# Patient Record
Sex: Female | Born: 1960 | Race: White | Hispanic: No | Marital: Married | Smoking: Never smoker
Health system: Southern US, Community
[De-identification: ages and names within clinical notes are randomized; demographics above are authoritative.]

## PROBLEM LIST (undated history)

## (undated) DIAGNOSIS — Z8619 Personal history of other infectious and parasitic diseases: Secondary | ICD-10-CM

## (undated) DIAGNOSIS — M1990 Unspecified osteoarthritis, unspecified site: Secondary | ICD-10-CM

## (undated) DIAGNOSIS — F329 Major depressive disorder, single episode, unspecified: Secondary | ICD-10-CM

## (undated) DIAGNOSIS — N200 Calculus of kidney: Secondary | ICD-10-CM

## (undated) DIAGNOSIS — R630 Anorexia: Secondary | ICD-10-CM

## (undated) DIAGNOSIS — F32A Depression, unspecified: Secondary | ICD-10-CM

## (undated) DIAGNOSIS — E785 Hyperlipidemia, unspecified: Secondary | ICD-10-CM

## (undated) DIAGNOSIS — F419 Anxiety disorder, unspecified: Secondary | ICD-10-CM

## (undated) DIAGNOSIS — R011 Cardiac murmur, unspecified: Secondary | ICD-10-CM

## (undated) DIAGNOSIS — I10 Essential (primary) hypertension: Secondary | ICD-10-CM

## (undated) HISTORY — DX: Hyperlipidemia, unspecified: E78.5

## (undated) HISTORY — DX: Personal history of other infectious and parasitic diseases: Z86.19

## (undated) HISTORY — DX: Depression, unspecified: F32.A

## (undated) HISTORY — DX: Cardiac murmur, unspecified: R01.1

## (undated) HISTORY — PX: APPENDECTOMY: SHX54

## (undated) HISTORY — DX: Anxiety disorder, unspecified: F41.9

## (undated) HISTORY — PX: OTHER SURGICAL HISTORY: SHX169

## (undated) HISTORY — DX: Major depressive disorder, single episode, unspecified: F32.9

## (undated) HISTORY — PX: TONSILLECTOMY AND ADENOIDECTOMY: SHX28

## (undated) HISTORY — DX: Anorexia: R63.0

## (undated) HISTORY — PX: ABDOMINAL SURGERY: SHX537

## (undated) HISTORY — DX: Unspecified osteoarthritis, unspecified site: M19.90

---
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
## 1998-03-09 ENCOUNTER — Encounter: Admission: RE | Admit: 1998-03-09 | Discharge: 1998-06-07 | Payer: Self-pay | Admitting: *Deleted

## 1998-09-07 ENCOUNTER — Other Ambulatory Visit: Admission: RE | Admit: 1998-09-07 | Discharge: 1998-09-07 | Payer: Self-pay | Admitting: Obstetrics & Gynecology

## 2000-03-10 ENCOUNTER — Other Ambulatory Visit: Admission: RE | Admit: 2000-03-10 | Discharge: 2000-03-10 | Payer: Self-pay | Admitting: Obstetrics & Gynecology

## 2000-03-31 ENCOUNTER — Ambulatory Visit (HOSPITAL_COMMUNITY): Admission: RE | Admit: 2000-03-31 | Discharge: 2000-03-31 | Payer: Self-pay | Admitting: Obstetrics & Gynecology

## 2001-07-05 ENCOUNTER — Other Ambulatory Visit: Admission: RE | Admit: 2001-07-05 | Discharge: 2001-07-05 | Payer: Self-pay | Admitting: Family Medicine

## 2002-08-27 ENCOUNTER — Other Ambulatory Visit: Admission: RE | Admit: 2002-08-27 | Discharge: 2002-08-27 | Payer: Self-pay | Admitting: Obstetrics & Gynecology

## 2002-09-04 ENCOUNTER — Observation Stay (HOSPITAL_COMMUNITY): Admission: RE | Admit: 2002-09-04 | Discharge: 2002-09-05 | Payer: Self-pay | Admitting: Obstetrics & Gynecology

## 2003-09-09 ENCOUNTER — Other Ambulatory Visit: Admission: RE | Admit: 2003-09-09 | Discharge: 2003-09-09 | Payer: Self-pay | Admitting: Obstetrics & Gynecology

## 2004-10-08 ENCOUNTER — Other Ambulatory Visit: Admission: RE | Admit: 2004-10-08 | Discharge: 2004-10-08 | Payer: Self-pay | Admitting: Obstetrics & Gynecology

## 2004-11-30 ENCOUNTER — Ambulatory Visit: Payer: Self-pay | Admitting: Cardiology

## 2005-10-19 ENCOUNTER — Other Ambulatory Visit: Admission: RE | Admit: 2005-10-19 | Discharge: 2005-10-19 | Payer: Self-pay | Admitting: Obstetrics and Gynecology

## 2006-06-16 ENCOUNTER — Ambulatory Visit: Payer: Self-pay | Admitting: Cardiology

## 2006-07-07 ENCOUNTER — Ambulatory Visit: Payer: Self-pay | Admitting: Cardiology

## 2012-07-30 ENCOUNTER — Other Ambulatory Visit: Payer: Self-pay | Admitting: Obstetrics & Gynecology

## 2012-07-30 DIAGNOSIS — R928 Other abnormal and inconclusive findings on diagnostic imaging of breast: Secondary | ICD-10-CM

## 2012-08-07 ENCOUNTER — Ambulatory Visit
Admission: RE | Admit: 2012-08-07 | Discharge: 2012-08-07 | Disposition: A | Discharge status: Home / Self Care | Payer: BC Managed Care – PPO | Source: Ambulatory Visit | Attending: Obstetrics & Gynecology | Admitting: Obstetrics & Gynecology

## 2012-08-07 DIAGNOSIS — R928 Other abnormal and inconclusive findings on diagnostic imaging of breast: Secondary | ICD-10-CM

## 2013-11-07 HISTORY — PX: BREAST BIOPSY: SHX20

## 2013-11-07 HISTORY — PX: COLONOSCOPY: SHX174

## 2014-05-31 DIAGNOSIS — I10 Essential (primary) hypertension: Secondary | ICD-10-CM

## 2014-05-31 DIAGNOSIS — F329 Major depressive disorder, single episode, unspecified: Secondary | ICD-10-CM | POA: Insufficient documentation

## 2014-05-31 DIAGNOSIS — F3341 Major depressive disorder, recurrent, in partial remission: Secondary | ICD-10-CM

## 2014-05-31 DIAGNOSIS — E785 Hyperlipidemia, unspecified: Secondary | ICD-10-CM | POA: Insufficient documentation

## 2014-05-31 DIAGNOSIS — F32A Depression, unspecified: Secondary | ICD-10-CM | POA: Insufficient documentation

## 2014-05-31 HISTORY — DX: Essential (primary) hypertension: I10

## 2014-05-31 HISTORY — DX: Major depressive disorder, recurrent, in partial remission: F33.41

## 2014-05-31 HISTORY — DX: Hyperlipidemia, unspecified: E78.5

## 2014-06-17 ENCOUNTER — Other Ambulatory Visit: Payer: Self-pay | Admitting: Obstetrics and Gynecology

## 2014-06-17 DIAGNOSIS — N63 Unspecified lump in unspecified breast: Secondary | ICD-10-CM

## 2014-06-17 DIAGNOSIS — N6459 Other signs and symptoms in breast: Secondary | ICD-10-CM

## 2014-06-17 DIAGNOSIS — N644 Mastodynia: Secondary | ICD-10-CM

## 2014-06-18 ENCOUNTER — Other Ambulatory Visit: Payer: Self-pay

## 2014-06-18 ENCOUNTER — Other Ambulatory Visit: Payer: Self-pay | Admitting: Obstetrics and Gynecology

## 2014-06-18 DIAGNOSIS — N6459 Other signs and symptoms in breast: Secondary | ICD-10-CM

## 2014-06-18 DIAGNOSIS — N63 Unspecified lump in unspecified breast: Secondary | ICD-10-CM

## 2014-06-18 DIAGNOSIS — N644 Mastodynia: Secondary | ICD-10-CM

## 2014-06-20 ENCOUNTER — Ambulatory Visit
Admission: RE | Admit: 2014-06-20 | Discharge: 2014-06-20 | Disposition: A | Discharge status: Home / Self Care | Payer: BC Managed Care – PPO | Source: Ambulatory Visit | Attending: Obstetrics and Gynecology | Admitting: Obstetrics and Gynecology

## 2014-06-20 ENCOUNTER — Other Ambulatory Visit: Payer: Self-pay | Admitting: Obstetrics and Gynecology

## 2014-06-20 DIAGNOSIS — N63 Unspecified lump in unspecified breast: Secondary | ICD-10-CM

## 2014-06-20 DIAGNOSIS — N644 Mastodynia: Secondary | ICD-10-CM

## 2014-06-20 DIAGNOSIS — N6459 Other signs and symptoms in breast: Secondary | ICD-10-CM

## 2014-06-27 ENCOUNTER — Ambulatory Visit
Admission: RE | Admit: 2014-06-27 | Discharge: 2014-06-27 | Disposition: A | Discharge status: Home / Self Care | Payer: BC Managed Care – PPO | Source: Ambulatory Visit | Attending: Obstetrics and Gynecology | Admitting: Obstetrics and Gynecology

## 2014-06-27 ENCOUNTER — Other Ambulatory Visit: Payer: BC Managed Care – PPO

## 2014-06-27 ENCOUNTER — Other Ambulatory Visit (HOSPITAL_COMMUNITY): Payer: Self-pay | Admitting: Diagnostic Radiology

## 2014-06-27 ENCOUNTER — Other Ambulatory Visit: Payer: Self-pay | Admitting: Obstetrics and Gynecology

## 2014-06-27 DIAGNOSIS — N6459 Other signs and symptoms in breast: Secondary | ICD-10-CM

## 2014-06-27 DIAGNOSIS — N644 Mastodynia: Secondary | ICD-10-CM

## 2014-06-27 DIAGNOSIS — N63 Unspecified lump in unspecified breast: Secondary | ICD-10-CM

## 2014-06-30 ENCOUNTER — Other Ambulatory Visit: Payer: BC Managed Care – PPO

## 2014-08-14 DIAGNOSIS — Z2821 Immunization not carried out because of patient refusal: Secondary | ICD-10-CM | POA: Insufficient documentation

## 2014-11-25 ENCOUNTER — Other Ambulatory Visit: Payer: Self-pay | Admitting: Obstetrics and Gynecology

## 2014-11-25 DIAGNOSIS — N6320 Unspecified lump in the left breast, unspecified quadrant: Secondary | ICD-10-CM

## 2014-12-02 ENCOUNTER — Other Ambulatory Visit: Payer: Self-pay | Admitting: Obstetrics and Gynecology

## 2014-12-02 DIAGNOSIS — N644 Mastodynia: Secondary | ICD-10-CM

## 2014-12-02 DIAGNOSIS — N6320 Unspecified lump in the left breast, unspecified quadrant: Secondary | ICD-10-CM

## 2014-12-10 ENCOUNTER — Ambulatory Visit
Admission: RE | Admit: 2014-12-10 | Discharge: 2014-12-10 | Disposition: A | Discharge status: Home / Self Care | Payer: BC Managed Care – PPO | Source: Ambulatory Visit | Attending: Obstetrics and Gynecology | Admitting: Obstetrics and Gynecology

## 2014-12-10 DIAGNOSIS — N644 Mastodynia: Secondary | ICD-10-CM

## 2014-12-10 DIAGNOSIS — N6320 Unspecified lump in the left breast, unspecified quadrant: Secondary | ICD-10-CM

## 2015-01-02 ENCOUNTER — Emergency Department (HOSPITAL_BASED_OUTPATIENT_CLINIC_OR_DEPARTMENT_OTHER): Payer: BC Managed Care – PPO

## 2015-01-02 ENCOUNTER — Emergency Department (HOSPITAL_BASED_OUTPATIENT_CLINIC_OR_DEPARTMENT_OTHER)
Admission: EM | Admit: 2015-01-02 | Discharge: 2015-01-03 | Disposition: A | Discharge status: Home / Self Care | Payer: BC Managed Care – PPO | Attending: Emergency Medicine | Admitting: Emergency Medicine

## 2015-01-02 ENCOUNTER — Encounter (HOSPITAL_BASED_OUTPATIENT_CLINIC_OR_DEPARTMENT_OTHER): Payer: Self-pay | Admitting: Emergency Medicine

## 2015-01-02 DIAGNOSIS — R109 Unspecified abdominal pain: Secondary | ICD-10-CM | POA: Diagnosis present

## 2015-01-02 DIAGNOSIS — N201 Calculus of ureter: Secondary | ICD-10-CM | POA: Diagnosis not present

## 2015-01-02 DIAGNOSIS — E871 Hypo-osmolality and hyponatremia: Secondary | ICD-10-CM | POA: Diagnosis not present

## 2015-01-02 DIAGNOSIS — I10 Essential (primary) hypertension: Secondary | ICD-10-CM | POA: Diagnosis not present

## 2015-01-02 HISTORY — DX: Calculus of kidney: N20.0

## 2015-01-02 HISTORY — DX: Essential (primary) hypertension: I10

## 2015-01-02 LAB — BASIC METABOLIC PANEL
Anion gap: 7 (ref 5–15)
BUN: 14 mg/dL (ref 6–23)
CO2: 21 mmol/L (ref 19–32)
CREATININE: 0.86 mg/dL (ref 0.50–1.10)
Calcium: 8.8 mg/dL (ref 8.4–10.5)
Chloride: 105 mmol/L (ref 96–112)
GFR calc Af Amer: 89 mL/min — ABNORMAL LOW (ref >90)
GFR calc non Af Amer: 77 mL/min — ABNORMAL LOW (ref >90)
Glucose, Bld: 114 mg/dL — ABNORMAL HIGH (ref 70–99)
Potassium: 3.5 mmol/L (ref 3.5–5.1)
Sodium: 133 mmol/L — ABNORMAL LOW (ref 135–145)

## 2015-01-02 LAB — CBC WITH DIFFERENTIAL/PLATELET
BASOS ABS: 0 10*3/uL (ref 0.0–0.1)
Basophils Relative: 0 % (ref 0–1)
Eosinophils Absolute: 0 10*3/uL (ref 0.0–0.7)
Eosinophils Relative: 0 % (ref 0–5)
HCT: 38.8 % (ref 36.0–46.0)
Hemoglobin: 12.9 g/dL (ref 12.0–15.0)
LYMPHS PCT: 12 % (ref 12–46)
Lymphs Abs: 1.2 10*3/uL (ref 0.7–4.0)
MCH: 31.8 pg (ref 26.0–34.0)
MCHC: 33.2 g/dL (ref 30.0–36.0)
MCV: 95.6 fL (ref 78.0–100.0)
Monocytes Absolute: 0.5 10*3/uL (ref 0.1–1.0)
Monocytes Relative: 5 % (ref 3–12)
NEUTROS ABS: 8.2 10*3/uL — AB (ref 1.7–7.7)
Neutrophils Relative %: 83 % — ABNORMAL HIGH (ref 43–77)
PLATELETS: 314 10*3/uL (ref 150–400)
RBC: 4.06 MIL/uL (ref 3.87–5.11)
RDW: 12 % (ref 11.5–15.5)
WBC: 10 10*3/uL (ref 4.0–10.5)

## 2015-01-02 LAB — URINALYSIS, ROUTINE W REFLEX MICROSCOPIC
Bilirubin Urine: NEGATIVE
GLUCOSE, UA: NEGATIVE mg/dL
KETONES UR: NEGATIVE mg/dL
Leukocytes, UA: NEGATIVE
NITRITE: NEGATIVE
Protein, ur: NEGATIVE mg/dL
Specific Gravity, Urine: 1.028 (ref 1.005–1.030)
Urobilinogen, UA: 0.2 mg/dL (ref 0.0–1.0)
pH: 5 (ref 5.0–8.0)

## 2015-01-02 LAB — URINE MICROSCOPIC-ADD ON

## 2015-01-02 MED ORDER — HYDROMORPHONE HCL 1 MG/ML IJ SOLN
1.0000 mg | Freq: Once | INTRAMUSCULAR | Status: AC
  Administered 2015-01-02: 1 mg via INTRAVENOUS
  Filled 2015-01-02: qty 1

## 2015-01-02 MED ORDER — ONDANSETRON HCL 4 MG/2ML IJ SOLN
4.0000 mg | Freq: Once | INTRAMUSCULAR | Status: AC
  Administered 2015-01-02: 4 mg via INTRAVENOUS
  Filled 2015-01-02: qty 2

## 2015-01-02 MED ORDER — KETOROLAC TROMETHAMINE 30 MG/ML IJ SOLN
30.0000 mg | Freq: Once | INTRAMUSCULAR | Status: AC
  Administered 2015-01-02: 30 mg via INTRAVENOUS
  Filled 2015-01-02: qty 1

## 2015-01-02 MED ORDER — ONDANSETRON HCL 4 MG/2ML IJ SOLN
4.0000 mg | Freq: Once | INTRAMUSCULAR | Status: AC
  Administered 2015-01-02: 4 mg via INTRAVENOUS

## 2015-01-02 MED ORDER — ONDANSETRON HCL 4 MG/2ML IJ SOLN
INTRAMUSCULAR | Status: AC
  Filled 2015-01-02: qty 2

## 2015-01-02 NOTE — ED Provider Notes (Signed)
CSN: [id number]     Arrival date & time 01/02/15  2148 History  This chart was scribed for Holly Booze, MD by Bronson Curb, ED Scribe. This patient was seen in room MH09/MH09 and the patient's care was started at 10:57 PM.    Chief Complaint  Patient presents with  . Flank Pain    The history is provided by the patient. No language interpreter was used.     HPI Comments: Holly Richardson is a 54 y.o. female, with history of kidney stones, who presents to the Emergency Department complaining of sharp, 10/10, left flank pain that began approximately 6 hours ago. Patient suspects the pain is related to a kidney stone. There is associated nausea, vomiting, and urgency. Patient states she took 2 Percocet approximately 10 minutes ago with only temporary relief. She denies fever or chills.  PCP: Derrell Lolling  Past Medical History  Diagnosis Date  . Kidney stones   . Hypertension   . Kidney stones    Past Surgical History  Procedure Laterality Date  . Abdominal surgery    . Appendectomy    . Cesarean section     No family history on file. History  Substance Use Topics  . Smoking status: Never Smoker   . Smokeless tobacco: Not on file  . Alcohol Use: 4.2 oz/week    7 Standard drinks or equivalent per week   OB History    No data available     Review of Systems  Gastrointestinal: Positive for nausea and vomiting.  Genitourinary: Positive for urgency and flank pain.  All other systems reviewed and are negative.     Allergies  Review of patient's allergies indicates no known allergies.  Home Medications   Prior to Admission medications   Not on File   Triage Vitals: BP 154/92 mmHg  Pulse 87  Temp(Src) 98.1 F (36.7 C) (Oral)  Resp 22  SpO2 99%  Physical Exam  Constitutional: She is oriented to person, place, and time. She appears well-developed and well-nourished.  Appears to be in pain.  HENT:  Head: Normocephalic and atraumatic.  Eyes: Conjunctivae and EOM  are normal.  Neck: Neck supple. No tracheal deviation present.  Cardiovascular: Normal rate.   Pulmonary/Chest: Effort normal. No respiratory distress.  Abdominal: Soft. Bowel sounds are decreased. There is no tenderness.  Bowel sounds are deceased.  Musculoskeletal: Normal range of motion.  Neurological: She is alert and oriented to person, place, and time.  Skin: Skin is warm and dry.  Psychiatric: She has a normal mood and affect. Her behavior is normal.  Nursing note and vitals reviewed.   ED Course  Procedures (including critical care time)  DIAGNOSTIC STUDIES: Oxygen Saturation is 99% on room air, normal by my interpretation.    COORDINATION OF CARE: At 2300 Discussed treatment plan with patient which includes pain medication and CT scan. Patient agrees.   Labs Review Results for orders placed or performed during the hospital encounter of 01/02/15  Urinalysis, Routine w reflex microscopic  Result Value Ref Range   Color, Urine YELLOW YELLOW   APPearance TURBID (A) CLEAR   Specific Gravity, Urine 1.028 1.005 - 1.030   pH 5.0 5.0 - 8.0   Glucose, UA NEGATIVE NEGATIVE mg/dL   Hgb urine dipstick LARGE (A) NEGATIVE   Bilirubin Urine NEGATIVE NEGATIVE   Ketones, ur NEGATIVE NEGATIVE mg/dL   Protein, ur NEGATIVE NEGATIVE mg/dL   Urobilinogen, UA 0.2 0.0 - 1.0 mg/dL   Nitrite NEGATIVE NEGATIVE  Leukocytes, UA NEGATIVE NEGATIVE  Urine microscopic-add on  Result Value Ref Range   Squamous Epithelial / LPF RARE RARE   WBC, UA 0-2 <3 WBC/hpf   RBC / HPF 11-20 <3 RBC/hpf   Bacteria, UA RARE RARE   Urine-Other AMORPHOUS URATES/PHOSPHATES   CBC with Differential  Result Value Ref Range   WBC 10.0 4.0 - 10.5 K/uL   RBC 4.06 3.87 - 5.11 MIL/uL   Hemoglobin 12.9 12.0 - 15.0 g/dL   HCT 16.1 09.6 - 04.5 %   MCV 95.6 78.0 - 100.0 fL   MCH 31.8 26.0 - 34.0 pg   MCHC 33.2 30.0 - 36.0 g/dL   RDW 40.9 81.1 - 91.4 %   Platelets 314 150 - 400 K/uL   Neutrophils Relative % 83 (H)  43 - 77 %   Neutro Abs 8.2 (H) 1.7 - 7.7 K/uL   Lymphocytes Relative 12 12 - 46 %   Lymphs Abs 1.2 0.7 - 4.0 K/uL   Monocytes Relative 5 3 - 12 %   Monocytes Absolute 0.5 0.1 - 1.0 K/uL   Eosinophils Relative 0 0 - 5 %   Eosinophils Absolute 0.0 0.0 - 0.7 K/uL   Basophils Relative 0 0 - 1 %   Basophils Absolute 0.0 0.0 - 0.1 K/uL  Basic metabolic panel  Result Value Ref Range   Sodium 133 (L) 135 - 145 mmol/L   Potassium 3.5 3.5 - 5.1 mmol/L   Chloride 105 96 - 112 mmol/L   CO2 21 19 - 32 mmol/L   Glucose, Bld 114 (H) 70 - 99 mg/dL   BUN 14 6 - 23 mg/dL   Creatinine, Ser 7.82 0.50 - 1.10 mg/dL   Calcium 8.8 8.4 - 95.6 mg/dL   GFR calc non Af Amer 77 (L) >90 mL/min   GFR calc Af Amer 89 (L) >90 mL/min   Anion gap 7 5 - 15    Imaging Review Ct Renal Stone Study  01/02/2015   CLINICAL DATA:  Left flank pain for 6 hr. History of kidney stones and hypertension.  EXAM: CT ABDOMEN AND PELVIS WITHOUT CONTRAST  TECHNIQUE: Multidetector CT imaging of the abdomen and pelvis was performed following the standard protocol without IV contrast.  COMPARISON:  None.  FINDINGS: Lung bases are clear.  4.4 mm stone in the distal left ureter at the ureterovesical junction with moderate proximal hydronephrosis and hydroureter. Stranding around the left ureter and kidney. No additional stones demonstrated. Right kidney in ureter appear normal. Bladder is decompressed.  Unenhanced appearance of the liver, spleen, gallbladder, pancreas, adrenal glands, inferior vena cava, and retroperitoneal lymph nodes is unremarkable. Calcification of the abdominal aorta without aneurysm. Stomach, small bowel, and colon are not abnormally distended. No free air or free fluid in the abdomen. Small umbilical hernia containing fat.  Pelvis: Uterus and ovaries are not enlarged. No free or loculated pelvic fluid collections. No pelvic mass or lymphadenopathy. Appendix is not identified. Degenerative changes in the spine. No  destructive bone lesions.  IMPRESSION: 4.4 mm stone in the distal left ureter with moderate proximal obstruction.   Electronically Signed   By: Burman Nieves M.D.   On: 01/02/2015 23:50    Images viewed by me.   MDM   Final diagnoses:  Left flank pain  Ureterolithiasis  Hyponatremia    Left flank pain consistent with ureteral colic. Urinalysis does show hematuria. She had only partial relief with initial dose of hydromorphone but she did have good relief of  nausea with ondansetron. She is given a second dose of hydromorphone with slight further improvement of pain. She is given a third dose of hydromorphone with some additional ondansetron and also dose of ketorolac. Following this, she had good relief of pain. Old records are reviewed and she has no relevant past visits and no imaging studies of the renal system in our system. She is sent for CT of the abdomen and pelvis which does show a 4.4 mm calculus in the distal left ureter with mild to moderate hydronephrosis. She is discharged with prescriptions for oxycodone-acetaminophen, tamsulosin, and ondansetron and is referred to urology for follow-up.  I personally performed the services described in this documentation, which was scribed in my presence. The recorded information has been reviewed and is accurate.      Holly Boozeavid Tinya Cadogan, MD 01/03/15 [phone]

## 2015-01-02 NOTE — ED Notes (Signed)
Patient transported to CT 

## 2015-01-02 NOTE — ED Notes (Addendum)
54 yo with left sided flank pain today with Vomiting. Hx of kidney stones. Took 2 percocet at pm w/ no relief. 10/10

## 2015-01-03 MED ORDER — DIPHENHYDRAMINE HCL 50 MG/ML IJ SOLN
25.0000 mg | Freq: Once | INTRAMUSCULAR | Status: AC
  Administered 2015-01-03: 25 mg via INTRAVENOUS

## 2015-01-03 MED ORDER — TAMSULOSIN HCL 0.4 MG PO CAPS: 0.4000 mg | ORAL_CAPSULE | Freq: Every day | ORAL | Status: DC

## 2015-01-03 MED ORDER — METOCLOPRAMIDE HCL 5 MG/ML IJ SOLN
INTRAMUSCULAR | Status: AC
  Administered 2015-01-03: 10 mg via INTRAVENOUS
  Filled 2015-01-03: qty 2

## 2015-01-03 MED ORDER — ONDANSETRON HCL 4 MG PO TABS: 4.0000 mg | ORAL_TABLET | Freq: Four times a day (QID) | ORAL | Status: DC | PRN

## 2015-01-03 MED ORDER — OXYCODONE-ACETAMINOPHEN 7.5-325 MG PO TABS: 1.0000 | ORAL_TABLET | ORAL | Status: DC | PRN

## 2015-01-03 MED ORDER — METOCLOPRAMIDE HCL 5 MG/ML IJ SOLN
10.0000 mg | Freq: Once | INTRAMUSCULAR | Status: AC
  Administered 2015-01-03: 10 mg via INTRAVENOUS

## 2015-01-03 MED ORDER — DIPHENHYDRAMINE HCL 50 MG/ML IJ SOLN
INTRAMUSCULAR | Status: AC
  Administered 2015-01-03: 25 mg via INTRAVENOUS
  Filled 2015-01-03: qty 1

## 2015-01-03 NOTE — Discharge Instructions (Signed)
Return if pain is not being controlled adequately, you are vomiting, or if you start running a fever.  Kidney Stones Kidney stones (urolithiasis) are deposits that form inside your kidneys. The intense pain is caused by the stone moving through the urinary tract. When the stone moves, the ureter goes into spasm around the stone. The stone is usually passed in the urine.  CAUSES   A disorder that makes certain neck glands produce too much parathyroid hormone (primary hyperparathyroidism).  A buildup of uric acid crystals, similar to gout in your joints.  Narrowing (stricture) of the ureter.  A kidney obstruction present at birth (congenital obstruction).  Previous surgery on the kidney or ureters.  Numerous kidney infections. SYMPTOMS   Feeling sick to your stomach (nauseous).  Throwing up (vomiting).  Blood in the urine (hematuria).  Pain that usually spreads (radiates) to the groin.  Frequency or urgency of urination. DIAGNOSIS   Taking a history and physical exam.  Blood or urine tests.  CT scan.  Occasionally, an examination of the inside of the urinary bladder (cystoscopy) is performed. TREATMENT   Observation.  Increasing your fluid intake.  Extracorporeal shock wave lithotripsy--This is a noninvasive procedure that uses shock waves to break up kidney stones.  Surgery may be needed if you have severe pain or persistent obstruction. There are various surgical procedures. Most of the procedures are performed with the use of small instruments. Only small incisions are needed to accommodate these instruments, so recovery time is minimized. The size, location, and chemical composition are all important variables that will determine the proper choice of action for you. Talk to your health care provider to better understand your situation so that you will minimize the risk of injury to yourself and your kidney.  HOME CARE INSTRUCTIONS   Drink enough water and fluids to  keep your urine clear or pale yellow. This will help you to pass the stone or stone fragments.  Strain all urine through the provided strainer. Keep all particulate matter and stones for your health care provider to see. The stone causing the pain may be as small as a grain of salt. It is very important to use the strainer each and every time you pass your urine. The collection of your stone will allow your health care provider to analyze it and verify that a stone has actually passed. The stone analysis will often identify what you can do to reduce the incidence of recurrences.  Only take over-the-counter or prescription medicines for pain, discomfort, or fever as directed by your health care provider.  Make a follow-up appointment with your health care provider as directed.  Get follow-up X-rays if required. The absence of pain does not always mean that the stone has passed. It may have only stopped moving. If the urine remains completely obstructed, it can cause loss of kidney function or even complete destruction of the kidney. It is your responsibility to make sure X-rays and follow-ups are completed. Ultrasounds of the kidney can show blockages and the status of the kidney. Ultrasounds are not associated with any radiation and can be performed easily in a matter of minutes. SEEK MEDICAL CARE IF:  You experience pain that is progressive and unresponsive to any pain medicine you have been prescribed. SEEK IMMEDIATE MEDICAL CARE IF:   Pain cannot be controlled with the prescribed medicine.  You have a fever or shaking chills.  The severity or intensity of pain increases over 18 hours and is not relieved  by pain medicine.  You develop a new onset of abdominal pain.  You feel faint or pass out.  You are unable to urinate. MAKE SURE YOU:   Understand these instructions.  Will watch your condition.  Will get help right away if you are not doing well or get worse. Document Released:  10/24/2005 Document Revised: 06/26/2013 Document Reviewed: 03/27/2013 [facility] Patient Information 2015 [place], [place]. This information is not intended to replace advice given to you by your health care provider. Make sure you discuss any questions you have with your health care provider.  Acetaminophen; Oxycodone tablets What is this medicine? ACETAMINOPHEN; OXYCODONE (a set a MEE noe fen; ox i KOE done) is a pain reliever. It is used to treat mild to moderate pain. This medicine may be used for other purposes; ask your health care provider or pharmacist if you have questions. COMMON BRAND NAME(S): Endocet, Magnacet, Narvox, Percocet, Perloxx, Primalev, Primlev, Roxicet, Xolox What should I tell my health care provider before I take this medicine? They need to know if you have any of these conditions: -brain tumor -Crohn's disease, inflammatory bowel disease, or ulcerative colitis -drug abuse or addiction -head injury -heart or circulation problems -if you often drink alcohol -kidney disease or problems going to the bathroom -liver disease -lung disease, asthma, or breathing problems -an unusual or allergic reaction to acetaminophen, oxycodone, other opioid analgesics, other medicines, foods, dyes, or preservatives -pregnant or trying to get pregnant -breast-feeding How should I use this medicine? Take this medicine by mouth with a full glass of water. Follow the directions on the prescription label. Take your medicine at regular intervals. Do not take your medicine more often than directed. Talk to your pediatrician regarding the use of this medicine in children. Special care may be needed. Patients over 39 years old may have a stronger reaction and need a smaller dose. Overdosage: If you think you have taken too much of this medicine contact a poison control center or emergency room at once. NOTE: This medicine is only for you. Do not share this medicine with others. What if I miss a  dose? If you miss a dose, take it as soon as you can. If it is almost time for your next dose, take only that dose. Do not take double or extra doses. What may interact with this medicine? -alcohol -antihistamines -barbiturates like amobarbital, butalbital, butabarbital, methohexital, pentobarbital, phenobarbital, thiopental, and secobarbital -benztropine -drugs for bladder problems like solifenacin, trospium, oxybutynin, tolterodine, hyoscyamine, and methscopolamine -drugs for breathing problems like ipratropium and tiotropium -drugs for certain stomach or intestine problems like propantheline, homatropine methylbromide, glycopyrrolate, atropine, belladonna, and dicyclomine -general anesthetics like etomidate, ketamine, nitrous oxide, propofol, desflurane, enflurane, halothane, isoflurane, and sevoflurane -medicines for depression, anxiety, or psychotic disturbances -medicines for sleep -muscle relaxants -naltrexone -narcotic medicines (opiates) for pain -phenothiazines like perphenazine, thioridazine, chlorpromazine, mesoridazine, fluphenazine, prochlorperazine, promazine, and trifluoperazine -scopolamine -tramadol -trihexyphenidyl This list may not describe all possible interactions. Give your health care provider a list of all the medicines, herbs, non-prescription drugs, or dietary supplements you use. Also tell them if you smoke, drink alcohol, or use illegal drugs. Some items may interact with your medicine. What should I watch for while using this medicine? Tell your doctor or health care professional if your pain does not go away, if it gets worse, or if you have new or a different type of pain. You may develop tolerance to the medicine. Tolerance means that you will need a higher dose of  the medication for pain relief. Tolerance is normal and is expected if you take this medicine for a long time. Do not suddenly stop taking your medicine because you may develop a severe reaction.  Your body becomes used to the medicine. This does NOT mean you are addicted. Addiction is a behavior related to getting and using a drug for a non-medical reason. If you have pain, you have a medical reason to take pain medicine. Your doctor will tell you how much medicine to take. If your doctor wants you to stop the medicine, the dose will be slowly lowered over time to avoid any side effects. You may get drowsy or dizzy. Do not drive, use machinery, or do anything that needs mental alertness until you know how this medicine affects you. Do not stand or sit up quickly, especially if you are an older patient. This reduces the risk of dizzy or fainting spells. Alcohol may interfere with the effect of this medicine. Avoid alcoholic drinks. There are different types of narcotic medicines (opiates) for pain. If you take more than one type at the same time, you may have more side effects. Give your health care provider a list of all medicines you use. Your doctor will tell you how much medicine to take. Do not take more medicine than directed. Call emergency for help if you have problems breathing. The medicine will cause constipation. Try to have a bowel movement at least every 2 to 3 days. If you do not have a bowel movement for 3 days, call your doctor or health care professional. Do not take Tylenol (acetaminophen) or medicines that have acetaminophen with this medicine. Too much acetaminophen can be very dangerous. Many nonprescription medicines contain acetaminophen. Always read the labels carefully to avoid taking more acetaminophen. What side effects may I notice from receiving this medicine? Side effects that you should report to your doctor or health care professional as soon as possible: -allergic reactions like skin rash, itching or hives, swelling of the face, lips, or tongue -breathing difficulties, wheezing -confusion -light headedness or fainting spells -severe stomach pain -unusually weak or  tired -yellowing of the skin or the whites of the eyes Side effects that usually do not require medical attention (report to your doctor or health care professional if they continue or are bothersome): -dizziness -drowsiness -nausea -vomiting This list may not describe all possible side effects. Call your doctor for medical advice about side effects. You may report side effects to FDA at 1-800-FDA-1088. Where should I keep my medicine? Keep out of the reach of children. This medicine can be abused. Keep your medicine in a safe place to protect it from theft. Do not share this medicine with anyone. Selling or giving away this medicine is dangerous and against the law. Store at room temperature between 20 and 25 degrees C (68 and 77 degrees F). Keep container tightly closed. Protect from light. This medicine may cause accidental overdose and death if it is taken by other adults, children, or pets. Flush any unused medicine down the toilet to reduce the chance of harm. Do not use the medicine after the expiration date. NOTE: This sheet is a summary. It may not cover all possible information. If you have questions about this medicine, talk to your doctor, pharmacist, or health care provider.  2015, [employer] Standard. (2013-06-17 13:17:35)  Tamsulosin capsules What is this medicine? TAMSULOSIN (tam SOO loe sin) is used to treat enlargement of the prostate gland in men, a condition  called benign prostatic hyperplasia or BPH. It is not for use in women. It works by relaxing muscles in the prostate and bladder neck. This improves urine flow and reduces BPH symptoms. This medicine may be used for other purposes; ask your health care provider or pharmacist if you have questions. COMMON BRAND NAME(S): Flomax What should I tell my health care provider before I take this medicine? They need to know if you have any of the following conditions: -advanced kidney disease -advanced liver disease -low  blood pressure -prostate cancer -an unusual or allergic reaction to tamsulosin, sulfa drugs, other medicines, foods, dyes, or preservatives -pregnant or trying to get pregnant -breast-feeding How should I use this medicine? Take this medicine by mouth about 30 minutes after the same meal every day. Follow the directions on the prescription label. Swallow the capsules whole with a glass of water. Do not crush, chew, or open capsules. Do not take your medicine more often than directed. Do not stop taking your medicine unless your doctor tells you to. Talk to your pediatrician regarding the use of this medicine in children. Special care may be needed. Overdosage: If you think you have taken too much of this medicine contact a poison control center or emergency room at once. NOTE: This medicine is only for you. Do not share this medicine with others. What if I miss a dose? If you miss a dose, take it as soon as you can. If it is almost time for your next dose, take only that dose. Do not take double or extra doses. If you stop taking your medicine for several days or more, ask your doctor or health care professional what dose you should start back on. What may interact with this medicine? -cimetidine -fluoxetine -ketoconazole -medicines for erectile disfunction like sildenafil, tadalafil, vardenafil -medicines for high blood pressure -other alpha-blockers like alfuzosin, doxazosin, phentolamine, phenoxybenzamine, prazosin, terazosin -warfarin This list may not describe all possible interactions. Give your health care provider a list of all the medicines, herbs, non-prescription drugs, or dietary supplements you use. Also tell them if you smoke, drink alcohol, or use illegal drugs. Some items may interact with your medicine. What should I watch for while using this medicine? Visit your doctor or health care professional for regular check ups. You will need lab work done before you start this  medicine and regularly while you are taking it. Check your blood pressure as directed. Ask your health care professional what your blood pressure should be, and when you should contact him or her. This medicine may make you feel dizzy or lightheaded. This is more likely to happen after the first dose, after an increase in dose, or during hot weather or exercise. Drinking alcohol and taking some medicines can make this worse. Do not drive, use machinery, or do anything that needs mental alertness until you know how this medicine affects you. Do not sit or stand up quickly. If you begin to feel dizzy, sit down until you feel better. These effects can decrease once your body adjusts to the medicine. Contact your doctor or health care professional right away if you have an erection that lasts longer than 4 hours or if it becomes painful. This may be a sign of a serious problem and must be treated right away to prevent permanent damage. If you are thinking of having cataract surgery, tell your eye surgeon that you have taken this medicine. What side effects may I notice from receiving this medicine? Side effects  that you should report to your doctor or health care professional as soon as possible: -allergic reactions like skin rash or itching, hives, swelling of the lips, mouth, tongue, or throat -breathing problems -change in vision -feeling faint or lightheaded -irregular heartbeat -prolonged or painful erection -weakness Side effects that usually do not require medical attention (report to your doctor or health care professional if they continue or are bothersome): -back pain -change in sex drive or performance -constipation, nausea or vomiting -cough -drowsy -runny or stuffy nose -trouble sleeping This list may not describe all possible side effects. Call your doctor for medical advice about side effects. You may report side effects to FDA at 1-800-FDA-1088. Where should I keep my  medicine? Keep out of the reach of children. Store at room temperature between 15 and 30 degrees C (59 and 86 degrees F). Throw away any unused medicine after the expiration date. NOTE: This sheet is a summary. It may not cover all possible information. If you have questions about this medicine, talk to your doctor, pharmacist, or health care provider.  2015, [employer] Standard. (2012-10-24 14:11:34)  Ondansetron tablets What is this medicine? ONDANSETRON (on DAN se tron) is used to treat nausea and vomiting caused by chemotherapy. It is also used to prevent or treat nausea and vomiting after surgery. This medicine may be used for other purposes; ask your health care provider or pharmacist if you have questions. COMMON BRAND NAME(S): Zofran What should I tell my health care provider before I take this medicine? They need to know if you have any of these conditions: -heart disease -history of irregular heartbeat -liver disease -low levels of magnesium or potassium in the blood -an unusual or allergic reaction to ondansetron, granisetron, other medicines, foods, dyes, or preservatives -pregnant or trying to get pregnant -breast-feeding How should I use this medicine? Take this medicine by mouth with a glass of water. Follow the directions on your prescription label. Take your doses at regular intervals. Do not take your medicine more often than directed. Talk to your pediatrician regarding the use of this medicine in children. Special care may be needed. Overdosage: If you think you have taken too much of this medicine contact a poison control center or emergency room at once. NOTE: This medicine is only for you. Do not share this medicine with others. What if I miss a dose? If you miss a dose, take it as soon as you can. If it is almost time for your next dose, take only that dose. Do not take double or extra doses. What may interact with this medicine? Do not take this medicine with  any of the following medications: -apomorphine -certain medicines for fungal infections like fluconazole, itraconazole, ketoconazole, posaconazole, voriconazole -cisapride -dofetilide -dronedarone -pimozide -thioridazine -ziprasidone This medicine may also interact with the following medications: -carbamazepine -certain medicines for depression, anxiety, or psychotic disturbances -fentanyl -linezolid -MAOIs like Carbex, Eldepryl, Marplan, Nardil, and Parnate -methylene blue (injected into a vein) -other medicines that prolong the QT interval (cause an abnormal heart rhythm) -phenytoin -rifampicin -tramadol This list may not describe all possible interactions. Give your health care provider a list of all the medicines, herbs, non-prescription drugs, or dietary supplements you use. Also tell them if you smoke, drink alcohol, or use illegal drugs. Some items may interact with your medicine. What should I watch for while using this medicine? Check with your doctor or health care professional right away if you have any sign of an allergic reaction. What side  effects may I notice from receiving this medicine? Side effects that you should report to your doctor or health care professional as soon as possible: -allergic reactions like skin rash, itching or hives, swelling of the face, lips or tongue -breathing problems -confusion -dizziness -fast or irregular heartbeat -feeling faint or lightheaded, falls -fever and chills -loss of balance or coordination -seizures -sweating -swelling of the hands or feet -tightness in the chest -tremors -unusually weak or tired Side effects that usually do not require medical attention (report to your doctor or health care professional if they continue or are bothersome): -constipation or diarrhea -headache This list may not describe all possible side effects. Call your doctor for medical advice about side effects. You may report side effects to FDA  at 1-800-FDA-1088. Where should I keep my medicine? Keep out of the reach of children. Store between 2 and 30 degrees C (36 and 86 degrees F). Throw away any unused medicine after the expiration date. NOTE: This sheet is a summary. It may not cover all possible information. If you have questions about this medicine, talk to your doctor, pharmacist, or health care provider.  2015, [employer] Standard. (2013-07-31 16:27:45)

## 2015-01-04 LAB — URINE CULTURE: Colony Count: 35000

## 2015-07-09 ENCOUNTER — Other Ambulatory Visit: Payer: Self-pay | Admitting: Obstetrics and Gynecology

## 2015-07-09 DIAGNOSIS — N6310 Unspecified lump in the right breast, unspecified quadrant: Principal | ICD-10-CM

## 2015-07-09 DIAGNOSIS — N6001 Solitary cyst of right breast: Secondary | ICD-10-CM

## 2015-07-09 DIAGNOSIS — N6315 Unspecified lump in the right breast, overlapping quadrants: Secondary | ICD-10-CM

## 2015-07-17 ENCOUNTER — Ambulatory Visit
Admission: RE | Admit: 2015-07-17 | Discharge: 2015-07-17 | Disposition: A | Discharge status: Home / Self Care | Payer: BC Managed Care – PPO | Source: Ambulatory Visit | Attending: Obstetrics and Gynecology | Admitting: Obstetrics and Gynecology

## 2015-07-17 DIAGNOSIS — N6315 Unspecified lump in the right breast, overlapping quadrants: Secondary | ICD-10-CM

## 2015-07-17 DIAGNOSIS — N6001 Solitary cyst of right breast: Secondary | ICD-10-CM

## 2015-07-17 DIAGNOSIS — N6310 Unspecified lump in the right breast, unspecified quadrant: Principal | ICD-10-CM

## 2015-07-29 ENCOUNTER — Telehealth: Payer: Self-pay | Admitting: *Deleted

## 2015-07-29 NOTE — Telephone Encounter (Signed)
called for fm hx & status, no answer either number.Holly KitchenMarland Richardson

## 2015-08-03 ENCOUNTER — Ambulatory Visit (INDEPENDENT_AMBULATORY_CARE_PROVIDER_SITE_OTHER): Payer: BC Managed Care – PPO | Admitting: Cardiovascular Disease

## 2015-08-03 ENCOUNTER — Encounter: Payer: Self-pay | Admitting: Cardiovascular Disease

## 2015-08-03 VITALS — BP 112/90 | HR 67 | Ht 65.0 in | Wt 177.4 lb

## 2015-08-03 DIAGNOSIS — R011 Cardiac murmur, unspecified: Secondary | ICD-10-CM | POA: Diagnosis not present

## 2015-08-03 DIAGNOSIS — I493 Ventricular premature depolarization: Secondary | ICD-10-CM | POA: Diagnosis not present

## 2015-08-03 DIAGNOSIS — E785 Hyperlipidemia, unspecified: Secondary | ICD-10-CM

## 2015-08-03 DIAGNOSIS — I10 Essential (primary) hypertension: Secondary | ICD-10-CM | POA: Diagnosis not present

## 2015-08-03 HISTORY — DX: Cardiac murmur, unspecified: R01.1

## 2015-08-03 LAB — BASIC METABOLIC PANEL
BUN: 12 mg/dL (ref 6–23)
CO2: 25 mEq/L (ref 19–32)
CREATININE: 0.65 mg/dL (ref 0.40–1.20)
Calcium: 9.7 mg/dL (ref 8.4–10.5)
Chloride: 103 mEq/L (ref 96–112)
GFR: 101.77 mL/min (ref >60.00)
GLUCOSE: 100 mg/dL — AB (ref 70–99)
POTASSIUM: 3.8 meq/L (ref 3.5–5.1)
Sodium: 139 mEq/L (ref 135–145)

## 2015-08-03 LAB — TSH: TSH: 3.07 u[IU]/mL (ref 0.35–4.50)

## 2015-08-03 NOTE — Patient Instructions (Signed)
Medication Instructions:  Your physician recommends that you continue on your current medications as directed. Please refer to the Current Medication list given to you today.   Labwork: TODAY - basic metabolic panel, TSH   Testing/Procedures: Your physician has requested that you have an echocardiogram. Echocardiography is a painless test that uses sound waves to create images of your heart. It provides your doctor with information about the size and shape of your heart and how well your heart's chambers and valves are working. This procedure takes approximately one hour. There are no restrictions for this procedure.   Follow-Up: Your physician recommends that you schedule a follow-up appointment in: as needed with Dr. Elease Hashimoto.

## 2015-08-03 NOTE — Progress Notes (Signed)
Cardiology Office Note   Date:  08/03/2015   ID:  Holly Richardson, DOB 11/30/1960, MRN [id number]  PCP:  Malka So., MD  Cardiologist:   Vesta Mixer, MD   Chief Complaint  Patient presents with  . Hypertension   Problem List 1. Essential Hypertension  2. Heart murmur  3. Hyperlipidemia    History of Present Illness: Holly Richardson is a 54 y.o. female who presents for  A new heart murmur. Holly Richardson is fairly active.  Walks at her work  No CP or dyspnea  Was found to have a heart murmur at her last physical exam .     Past Medical History  Diagnosis Date  . Kidney stones   . Hypertension   . Kidney stones     Past Surgical History  Procedure Laterality Date  . Abdominal surgery    . Appendectomy    . Cesarean section       Current Outpatient Prescriptions  Medication Sig Dispense Refill  . atorvastatin (LIPITOR) 10 MG tablet Take 10 mg by mouth daily.    . fenofibrate 160 MG tablet Take 160 mg by mouth daily.    Marland Kitchen lamoTRIgine (LAMICTAL) 100 MG tablet Take 100 mg by mouth daily. TAKE 2.5 TABLETS BY MOUTH DAILY    . telmisartan (MICARDIS) 20 MG tablet Take 20 mg by mouth daily.    . Vortioxetine HBr (TRINTELLIX) 5 MG TABS Take 5 mg by mouth daily.     No current facility-administered medications for this visit.    Allergies:   Codeine    Social History:  The patient  reports that she has never smoked. She does not have any smokeless tobacco history on file. She reports that she drinks about 4.2 oz of alcohol per week. She reports that she does not use illicit drugs.   Family History:  The patient's family history includes CAD in her mother.    ROS:  Please see the history of present illness.    Review of Systems: Constitutional:  denies fever, chills, diaphoresis, appetite change and fatigue.  HEENT: denies photophobia, eye pain, redness, hearing loss, ear pain, congestion, sore throat, rhinorrhea, sneezing, neck pain, neck  stiffness and tinnitus.  Respiratory: denies SOB, DOE, cough, chest tightness, and wheezing.  Cardiovascular: denies chest pain, palpitations and leg swelling.  Gastrointestinal: denies nausea, vomiting, abdominal pain, diarrhea, constipation, blood in stool.  Genitourinary: denies dysuria, urgency, frequency, hematuria, flank pain and difficulty urinating.  Musculoskeletal: denies  myalgias, back pain, joint swelling, arthralgias and gait problem.   Skin: denies pallor, rash and wound.  Neurological: denies dizziness, seizures, syncope, weakness, light-headedness, numbness and headaches.   Hematological: denies adenopathy, easy bruising, personal or family bleeding history.  Psychiatric/ Behavioral: denies suicidal ideation, mood changes, confusion, nervousness, sleep disturbance and agitation.       All other systems are reviewed and negative.    PHYSICAL EXAM: VS:  BP 112/90 mmHg  Pulse 67  Ht  (1.651 m)  Wt 80.468 kg (177 lb 6.4 oz)  BMI 29.52 kg/m2 , BMI Body mass index is 29.52 kg/(m^2). GEN: Well nourished, well developed, in no acute distress HEENT: normal Neck: no JVD, carotid bruits, or masses Cardiac: RRR; no murmurs, rubs, or gallops,no edema  Respiratory:  clear to auscultation bilaterally, normal work of breathing GI: soft, nontender, nondistended, + BS MS: no deformity or atrophy Skin: warm and dry, no rash Neuro:  Strength and sensation are intact Psych: normal  EKG:  EKG is ordered today. The ekg ordered today demonstrates NSR at 67 . Occasional PVCs.    Recent Labs: 01/02/2015: BUN 14; Creatinine, Ser 0.86; Hemoglobin 12.9; Platelets 314; Potassium 3.5; Sodium 133*    Lipid Panel No results found for: CHOL, TRIG, HDL, CHOLHDL, VLDL, LDLCALC, LDLDIRECT    Wt Readings from Last 3 Encounters:  08/03/15 80.468 kg (177 lb 6.4 oz)      Other studies Reviewed: Additional studies/ records that were reviewed today include: . Review of the above  records demonstrates:    ASSESSMENT AND PLAN:  1. Essential Hypertension -  Her blood pressure is fairly well-controlled. Continue current medications. 2. Heart murmur -  She has very soft systolic murmur. We'll get an  Echocardiogram  for further evaluation. 3. Hyperlipidemia -  She's currently on fenofibrate 160 mg  and atorvastatin 10 mg . Her lipids are followed by her general medical doctor.  4. Premature ventricular contractions. She has frequent PVCs on exam and on her EKG.Holly Richardson check a basic medical profile also check a TSH today. We'll follow her as needed.  Current medicines are reviewed at length with the patient today.  The patient does not have concerns regarding medicines.  The following changes have been made:  no change  Labs/ tests ordered today include:   Orders Placed This Encounter  Procedures  . Basic Metabolic Panel (BMET)  . TSH  . EKG 12-Lead  . Echocardiogram     Disposition:   FU with me as needed.      Nahser, Deloris Ping, MD  08/03/2015 4:02 PM    [facility] [address] [place], [place], [place]  [address] Phone: [phone]; Fax: [phone]   [facility]  [address] [place], [place]  [address] [phone]   Fax [phone]

## 2015-08-04 ENCOUNTER — Telehealth: Payer: Self-pay | Admitting: Cardiovascular Disease

## 2015-08-04 NOTE — Telephone Encounter (Signed)
New message    Patient calling regarding test results    Can leave test results on answer machine.

## 2015-08-04 NOTE — Telephone Encounter (Signed)
Lab results and plan of care reviewed with patient who verbalized understanding and agreement 

## 2015-09-07 ENCOUNTER — Other Ambulatory Visit: Payer: Self-pay

## 2015-09-07 ENCOUNTER — Ambulatory Visit (HOSPITAL_COMMUNITY): Payer: BC Managed Care – PPO | Attending: Cardiovascular Disease

## 2015-09-07 DIAGNOSIS — I071 Rheumatic tricuspid insufficiency: Secondary | ICD-10-CM | POA: Diagnosis not present

## 2015-09-07 DIAGNOSIS — I358 Other nonrheumatic aortic valve disorders: Secondary | ICD-10-CM | POA: Insufficient documentation

## 2015-09-07 DIAGNOSIS — I10 Essential (primary) hypertension: Secondary | ICD-10-CM | POA: Insufficient documentation

## 2015-09-07 DIAGNOSIS — R011 Cardiac murmur, unspecified: Secondary | ICD-10-CM

## 2016-01-05 ENCOUNTER — Other Ambulatory Visit: Payer: Self-pay

## 2016-01-05 DIAGNOSIS — Z1231 Encounter for screening mammogram for malignant neoplasm of breast: Secondary | ICD-10-CM

## 2016-01-20 ENCOUNTER — Ambulatory Visit: Payer: BC Managed Care – PPO

## 2016-02-09 ENCOUNTER — Ambulatory Visit
Admission: RE | Admit: 2016-02-09 | Discharge: 2016-02-09 | Disposition: A | Discharge status: Home / Self Care | Payer: BC Managed Care – PPO | Source: Ambulatory Visit

## 2016-02-09 DIAGNOSIS — Z1231 Encounter for screening mammogram for malignant neoplasm of breast: Secondary | ICD-10-CM

## 2016-10-24 DIAGNOSIS — E88819 Insulin resistance, unspecified: Secondary | ICD-10-CM | POA: Insufficient documentation

## 2016-10-24 DIAGNOSIS — E8881 Metabolic syndrome: Secondary | ICD-10-CM | POA: Insufficient documentation

## 2016-11-07 HISTORY — PX: BREAST BIOPSY: SHX20

## 2017-03-16 DIAGNOSIS — R140 Abdominal distension (gaseous): Secondary | ICD-10-CM | POA: Insufficient documentation

## 2017-03-16 DIAGNOSIS — K5909 Other constipation: Secondary | ICD-10-CM | POA: Insufficient documentation

## 2017-04-06 ENCOUNTER — Other Ambulatory Visit: Payer: Self-pay | Admitting: Obstetrics and Gynecology

## 2017-04-06 DIAGNOSIS — N63 Unspecified lump in unspecified breast: Secondary | ICD-10-CM

## 2017-04-06 DIAGNOSIS — Z1231 Encounter for screening mammogram for malignant neoplasm of breast: Secondary | ICD-10-CM

## 2017-04-12 ENCOUNTER — Ambulatory Visit
Admission: RE | Admit: 2017-04-12 | Discharge: 2017-04-12 | Disposition: A | Discharge status: Home / Self Care | Payer: BC Managed Care – PPO | Source: Ambulatory Visit | Attending: Obstetrics and Gynecology | Admitting: Obstetrics and Gynecology

## 2017-04-12 ENCOUNTER — Other Ambulatory Visit: Payer: Self-pay | Admitting: Obstetrics and Gynecology

## 2017-04-12 DIAGNOSIS — N63 Unspecified lump in unspecified breast: Secondary | ICD-10-CM

## 2017-04-18 ENCOUNTER — Other Ambulatory Visit: Payer: Self-pay | Admitting: Obstetrics and Gynecology

## 2017-04-18 DIAGNOSIS — N63 Unspecified lump in unspecified breast: Secondary | ICD-10-CM

## 2017-04-20 ENCOUNTER — Ambulatory Visit
Admission: RE | Admit: 2017-04-20 | Discharge: 2017-04-20 | Disposition: A | Discharge status: Home / Self Care | Payer: BC Managed Care – PPO | Source: Ambulatory Visit | Attending: Obstetrics and Gynecology | Admitting: Obstetrics and Gynecology

## 2017-04-20 DIAGNOSIS — N63 Unspecified lump in unspecified breast: Secondary | ICD-10-CM

## 2017-08-08 ENCOUNTER — Other Ambulatory Visit: Payer: Self-pay | Admitting: Obstetrics & Gynecology

## 2017-08-08 DIAGNOSIS — N6310 Unspecified lump in the right breast, unspecified quadrant: Secondary | ICD-10-CM

## 2017-08-22 ENCOUNTER — Ambulatory Visit
Admission: RE | Admit: 2017-08-22 | Discharge: 2017-08-22 | Disposition: A | Discharge status: Home / Self Care | Payer: BC Managed Care – PPO | Source: Ambulatory Visit | Attending: Obstetrics & Gynecology | Admitting: Obstetrics & Gynecology

## 2017-08-22 DIAGNOSIS — N6310 Unspecified lump in the right breast, unspecified quadrant: Secondary | ICD-10-CM

## 2017-11-17 LAB — LIPID PANEL
CHOLESTEROL: 169 (ref 0–200)
HDL: 52 (ref 35–70)
LDL Cholesterol: 101
TRIGLYCERIDES: 82 (ref 40–160)

## 2017-11-17 LAB — HEPATIC FUNCTION PANEL
ALT: 18 (ref 7–35)
AST: 22 (ref 13–35)
Alkaline Phosphatase: 48 (ref 25–125)
Bilirubin, Total: 0.4

## 2018-03-20 LAB — BASIC METABOLIC PANEL
BUN: 24 — AB (ref 4–21)
CREATININE: 0.5 (ref <=1.1)
Glucose: 84
POTASSIUM: 4.1 (ref 3.4–5.3)
Sodium: 142 (ref 137–147)

## 2018-08-06 DIAGNOSIS — Z8659 Personal history of other mental and behavioral disorders: Secondary | ICD-10-CM | POA: Insufficient documentation

## 2018-08-06 LAB — CBC AND DIFFERENTIAL
HCT: 36 (ref 36–46)
HEMOGLOBIN: 12.3 (ref 12.0–16.0)
Platelets: 342 (ref 150–399)
WBC: 6

## 2018-08-06 LAB — VITAMIN D 25 HYDROXY (VIT D DEFICIENCY, FRACTURES): Vit D, 25-Hydroxy: 33

## 2018-08-06 LAB — VITAMIN B12: VITAMIN B 12: 302

## 2018-11-28 DIAGNOSIS — M170 Bilateral primary osteoarthritis of knee: Secondary | ICD-10-CM

## 2018-11-28 HISTORY — DX: Bilateral primary osteoarthritis of knee: M17.0

## 2019-01-22 ENCOUNTER — Other Ambulatory Visit: Payer: Self-pay

## 2019-01-22 ENCOUNTER — Ambulatory Visit: Payer: BC Managed Care – PPO | Admitting: Family Medicine

## 2019-01-22 ENCOUNTER — Encounter: Payer: Self-pay | Admitting: Family Medicine

## 2019-01-22 VITALS — BP 120/86 | HR 68 | Temp 98.0°F | Ht 64.25 in | Wt 182.4 lb

## 2019-01-22 DIAGNOSIS — E785 Hyperlipidemia, unspecified: Secondary | ICD-10-CM | POA: Diagnosis not present

## 2019-01-22 DIAGNOSIS — I10 Essential (primary) hypertension: Secondary | ICD-10-CM

## 2019-01-22 DIAGNOSIS — E88819 Insulin resistance, unspecified: Secondary | ICD-10-CM

## 2019-01-22 DIAGNOSIS — R7303 Prediabetes: Secondary | ICD-10-CM

## 2019-01-22 DIAGNOSIS — E8881 Metabolic syndrome: Secondary | ICD-10-CM

## 2019-01-22 DIAGNOSIS — F3181 Bipolar II disorder: Secondary | ICD-10-CM

## 2019-01-22 DIAGNOSIS — M170 Bilateral primary osteoarthritis of knee: Secondary | ICD-10-CM

## 2019-01-22 DIAGNOSIS — F509 Eating disorder, unspecified: Secondary | ICD-10-CM

## 2019-01-22 LAB — CBC WITH DIFFERENTIAL/PLATELET
Basophils Absolute: 0 10*3/uL (ref 0.0–0.1)
Basophils Relative: 0.2 % (ref 0.0–3.0)
Eosinophils Absolute: 0.1 10*3/uL (ref 0.0–0.7)
Eosinophils Relative: 1.5 % (ref 0.0–5.0)
HCT: 39.3 % (ref 36.0–46.0)
Hemoglobin: 13.6 g/dL (ref 12.0–15.0)
Lymphocytes Relative: 37.3 % (ref 12.0–46.0)
Lymphs Abs: 2.1 10*3/uL (ref 0.7–4.0)
MCHC: 34.5 g/dL (ref 30.0–36.0)
MCV: 90.5 fl (ref 78.0–100.0)
Monocytes Absolute: 0.5 10*3/uL (ref 0.1–1.0)
Monocytes Relative: 8.9 % (ref 3.0–12.0)
Neutro Abs: 3 10*3/uL (ref 1.4–7.7)
Neutrophils Relative %: 52.1 % (ref 43.0–77.0)
Platelets: 302 10*3/uL (ref 150.0–400.0)
RBC: 4.35 Mil/uL (ref 3.87–5.11)
RDW: 13.3 % (ref 11.5–15.5)
WBC: 5.7 10*3/uL (ref 4.0–10.5)

## 2019-01-22 LAB — LIPID PANEL
Cholesterol: 222 mg/dL — ABNORMAL HIGH (ref 0–200)
HDL: 51.7 mg/dL (ref >39.00)
LDL Cholesterol: 132 mg/dL — ABNORMAL HIGH (ref 0–99)
NonHDL: 170.75
Total CHOL/HDL Ratio: 4
Triglycerides: 192 mg/dL — ABNORMAL HIGH (ref 0.0–149.0)
VLDL: 38.4 mg/dL (ref 0.0–40.0)

## 2019-01-22 LAB — COMPREHENSIVE METABOLIC PANEL
ALT: 18 U/L (ref 0–35)
AST: 16 U/L (ref 0–37)
Albumin: 4.7 g/dL (ref 3.5–5.2)
Alkaline Phosphatase: 67 U/L (ref 39–117)
BUN: 16 mg/dL (ref 6–23)
CO2: 27 mEq/L (ref 19–32)
Calcium: 9.4 mg/dL (ref 8.4–10.5)
Chloride: 109 mEq/L (ref 96–112)
Creatinine, Ser: 0.55 mg/dL (ref 0.40–1.20)
GFR: 114.6 mL/min (ref >60.00)
Glucose, Bld: 110 mg/dL — ABNORMAL HIGH (ref 70–99)
Potassium: 4.7 mEq/L (ref 3.5–5.1)
Sodium: 144 mEq/L (ref 135–145)
Total Bilirubin: 0.4 mg/dL (ref 0.2–1.2)
Total Protein: 6.8 g/dL (ref 6.0–8.3)

## 2019-01-22 LAB — HEMOGLOBIN A1C: Hgb A1c MFr Bld: 5.5 % (ref 4.6–6.5)

## 2019-01-22 LAB — TSH: TSH: 1.39 u[IU]/mL (ref 0.35–4.50)

## 2019-01-24 ENCOUNTER — Encounter: Payer: Self-pay | Admitting: Family Medicine

## 2019-01-24 DIAGNOSIS — F509 Eating disorder, unspecified: Secondary | ICD-10-CM | POA: Insufficient documentation

## 2019-01-24 DIAGNOSIS — F3181 Bipolar II disorder: Secondary | ICD-10-CM | POA: Insufficient documentation

## 2019-01-24 HISTORY — DX: Bipolar II disorder: F31.81

## 2019-01-24 NOTE — Assessment & Plan Note (Signed)
Patient held medications for one month so that I could see her "clean." She is followed by Psychiatry and counselor. See PHQ9 and GAD7. Positive for SI but no plans to act and with barriers. She will restart her medications today. Labs can be completed here in the future if easier for Psychiatry.

## 2019-01-24 NOTE — Progress Notes (Signed)
Holly Richardson is a 58 y.o. female is here to [facility].  Assessment and Plan:   Insulin resistance, improved Lab Results  Component Value Date   HGBA1C 5.5 01/22/2019   Wt Readings from Last 3 Encounters:  01/22/19 182 lb 6.1 oz (82.7 kg)  08/03/15 177 lb 6.4 oz (80.5 kg)   Not on medication. Working on [employer] and healthy food choices.   Hyperlipidemia LDL goal <130, Rx Zocor and fenofibrate Is the patient taking medications without problems? Yes. Does the patient complain of muscle aches?  No. Trying to exercise on a regular basis? No. Compliant with diet? Yes.  Lab Results  Component Value Date   CHOL 222 (H) 01/22/2019   HDL 51.70 01/22/2019   LDLCALC 132 (H) 01/22/2019   TRIG 192.0 (H) 01/22/2019   CHOLHDL 4 01/22/2019   Lab Results  Component Value Date   ALT 18 01/22/2019   AST 16 01/22/2019   ALKPHOS 67 01/22/2019   BILITOT 0.4 01/22/2019      Essential hypertension, now controlled on no medication BP Readings from Last 3 Encounters:  01/22/19 120/86  08/03/15 112/90  01/03/15 (!) 105/53   Historical diagnosis. On no medications. Controlled.   Bipolar 2 disorder (HCC), Rx Prozac and Lithium Patient held medications for one month so that I could see her "clean." She is followed by Psychiatry and counselor. See PHQ9 and GAD7. Positive for SI but no plans to act and with barriers. She will restart her medications today. Labs can be completed here in the future if easier for Psychiatry.   Primary osteoarthritis of both knees, Rx Celebrex Chronic knee pain inhibits exercise. Taking Celebrex regularly. Discussed PT Pilates. See AVS.   Orders Placed This Encounter  Procedures  . CBC with Differential/Platelet  . Comprehensive metabolic panel  . Hemoglobin A1c  . Lipid panel  . TSH   Subjective:   HPI: New patient visit. Found [place] through [employer] [facility]. Long Hx of eating disorders complicating bipolar disorder. See  Assessment and Plan section for Problem Based Charting of issues discussed today.   Health Maintenance:   Health Maintenance Due  Topic Date Due  . Hepatitis C Screening  [birth year]  . HIV Screening  09/07/1978  . PAP SMEAR-Modifier  09/07/1984  . COLONOSCOPY  09/07/2013  . INFLUENZA VACCINE  06/07/2018   Depression screen PHQ 2/9 01/22/2019  Decreased Interest 2  Down, Depressed, Hopeless 2  PHQ - 2 Score 4  Altered sleeping 2  Tired, decreased energy 2  Change in appetite 2  Feeling bad or failure about yourself  2  Trouble concentrating 2  Moving slowly or fidgety/restless 2  Suicidal thoughts 2  PHQ-9 Score 18  Difficult doing work/chores Somewhat difficult   GAD 7 : Generalized Anxiety Score 01/22/2019  Nervous, Anxious, on Edge 2  Control/stop worrying 2  Worry too much - different things 2  Trouble relaxing 2  Restless 2  Easily annoyed or irritable 2  Afraid - awful might happen 2  Total GAD 7 Score 14  Anxiety Difficulty Somewhat difficult   PMHx, SurgHx, SocialHx, FamHx, Medications, and Allergies were reviewed in the Visit Navigator and updated as appropriate.   Patient Active Problem List   Diagnosis Date Noted  . Bipolar 2 disorder (HCC), Rx Prozac and Lithium 01/24/2019  . Eating disorder in remission 01/24/2019  . Primary osteoarthritis of both knees, Rx Celebrex 11/28/2018  . Insulin resistance, improved 10/24/2016  . Cardiac murmur, unspecified  08/03/2015  . Essential hypertension, now controlled on no medication 05/31/2014  . Hyperlipidemia LDL goal <130, Rx Zocor and fenofibrate 05/31/2014  . Recurrent major depressive disorder, in partial remission (HCC) 05/31/2014   Social History   Tobacco Use  . Smoking status: Never Smoker  . Smokeless tobacco: Never Used  Substance Use Topics  . Alcohol use: Yes    Alcohol/week: 7.0 standard drinks    Types: 7 Standard drinks or equivalent per week  . Drug use: No   Current Medications and Allergies:    Current Outpatient Medications:  .  atorvastatin (LIPITOR) 10 MG tablet, Take 10 mg by mouth daily., Disp: , Rfl:  .  celecoxib (CELEBREX) 200 MG capsule, Take 200 mg by mouth 2 (two) times daily. , Disp: , Rfl:  .  Cholecalciferol (VITAMIN D-1000 MAX ST) 25 MCG (1000 UT) tablet, Take 1,000 Units by mouth daily. , Disp: , Rfl:  .  fenofibrate 160 MG tablet, Take 160 mg by mouth daily., Disp: , Rfl:  .  FLUoxetine (PROZAC) 20 MG capsule, Take 20 mg by mouth daily. , Disp: , Rfl:  .  lithium carbonate 300 MG capsule, Take 300 mg by mouth 2 (two) times daily with a meal. , Disp: , Rfl:    Allergies  Allergen Reactions  . Codeine     Headache   Review of Systems   Pertinent items are noted in the HPI. Otherwise, ROS is negative.  Vitals:   Vitals:   01/22/19 0928  BP: 120/86  Pulse: 68  Temp: 98 F (36.7 C)  TempSrc: Oral  SpO2: 99%  Weight: 182 lb 6.1 oz (82.7 kg)  Height: 5' 4.25" (1.632 m)     Body mass index is 31.06 kg/m. Physical Exam:   General: Cooperative, alert and oriented, well developed, well nourished, in no acute distress. HEENT: Pupils equal round reactive light and extraocular movements intact. Conjunctivae and lids unremarkable. No pallor or cyanosis, dentition good. Neck: No thyromegaly.  Cardiovascular: Regular rhythm. No murmurs appreciated.  Lungs: Normal work of breathing. Clear bilaterally without rales, rhonchi, or wheezing.  Abdomen: Soft, nontender, no masses. Normal bowel sounds. Extremities: No clubbing, cyanosis, erythema. No edema.  Skin: Warm and dry. Neurologic: No focal deficits.  Psychiatric: Normal affect and thought content.   . Reviewed expectations re: course of current medical issues. . Discussed self-management of symptoms. . Outlined signs and symptoms indicating need for more acute intervention. . Patient verbalized understanding and all questions were answered. Marland Kitchen Health Maintenance issues including appropriate healthy diet,  exercise, and smoking avoidance were discussed with patient. . See orders for this visit as documented in the electronic medical record. . Patient received an After Visit Summary.  Helane Rima, DO [place], [place] 01/24/2019   Records requested if needed. Time spent with the patient: 45 minutes, of which >50% was spent in obtaining information about her symptoms, reviewing her previous labs, evaluations, and treatments, counseling her about her condition (please see the discussed topics above), and developing a plan to further investigate it; she had a number of questions which I addressed.

## 2019-01-24 NOTE — Assessment & Plan Note (Signed)
Is the patient taking medications without problems? Yes. Does the patient complain of muscle aches?  No. Trying to exercise on a regular basis? No. Compliant with diet? Yes.  Lab Results  Component Value Date   CHOL 222 (H) 01/22/2019   HDL 51.70 01/22/2019   LDLCALC 132 (H) 01/22/2019   TRIG 192.0 (H) 01/22/2019   CHOLHDL 4 01/22/2019   Lab Results  Component Value Date   ALT 18 01/22/2019   AST 16 01/22/2019   ALKPHOS 67 01/22/2019   BILITOT 0.4 01/22/2019

## 2019-01-24 NOTE — Assessment & Plan Note (Signed)
BP Readings from Last 3 Encounters:  01/22/19 120/86  08/03/15 112/90  01/03/15 (!) 105/53   Historical diagnosis. On no medications. Controlled.

## 2019-01-24 NOTE — Assessment & Plan Note (Signed)
Chronic knee pain inhibits exercise. Taking Celebrex regularly. Discussed PT Pilates. See AVS.

## 2019-01-24 NOTE — Assessment & Plan Note (Signed)
Lab Results  Component Value Date   HGBA1C 5.5 01/22/2019   Wt Readings from Last 3 Encounters:  01/22/19 182 lb 6.1 oz (82.7 kg)  08/03/15 177 lb 6.4 oz (80.5 kg)   Not on medication. Working on [employer] and healthy food choices.

## 2019-02-04 ENCOUNTER — Other Ambulatory Visit: Payer: Self-pay

## 2019-02-04 ENCOUNTER — Encounter: Payer: Self-pay | Admitting: Family Medicine

## 2019-02-04 MED ORDER — ATORVASTATIN CALCIUM 10 MG PO TABS: 10.0000 mg | ORAL_TABLET | Freq: Every day | ORAL | 1 refills | Status: DC

## 2019-02-11 ENCOUNTER — Encounter: Payer: Self-pay | Admitting: Family Medicine

## 2019-02-11 MED ORDER — VALACYCLOVIR HCL 1 G PO TABS: 2000.0000 mg | ORAL_TABLET | Freq: Two times a day (BID) | ORAL | 5 refills | Status: AC

## 2019-04-24 ENCOUNTER — Encounter: Payer: Self-pay | Admitting: Family Medicine

## 2019-04-24 ENCOUNTER — Ambulatory Visit (INDEPENDENT_AMBULATORY_CARE_PROVIDER_SITE_OTHER): Payer: BC Managed Care – PPO | Admitting: Family Medicine

## 2019-04-24 ENCOUNTER — Other Ambulatory Visit: Payer: Self-pay

## 2019-04-24 ENCOUNTER — Ambulatory Visit: Payer: BC Managed Care – PPO | Admitting: Family Medicine

## 2019-04-24 VITALS — BP 124/82 | HR 72 | Temp 97.8°F | Ht 64.25 in | Wt 181.4 lb

## 2019-04-24 DIAGNOSIS — I10 Essential (primary) hypertension: Secondary | ICD-10-CM

## 2019-04-24 DIAGNOSIS — F3181 Bipolar II disorder: Secondary | ICD-10-CM

## 2019-04-24 DIAGNOSIS — E8881 Metabolic syndrome: Secondary | ICD-10-CM

## 2019-04-24 DIAGNOSIS — F509 Eating disorder, unspecified: Secondary | ICD-10-CM

## 2019-04-24 DIAGNOSIS — E88819 Insulin resistance, unspecified: Secondary | ICD-10-CM

## 2019-04-24 DIAGNOSIS — E785 Hyperlipidemia, unspecified: Secondary | ICD-10-CM

## 2019-04-24 DIAGNOSIS — M170 Bilateral primary osteoarthritis of knee: Secondary | ICD-10-CM

## 2019-04-24 NOTE — Progress Notes (Signed)
Virtual Visit via Video   Due to the COVID-19 pandemic, this visit was completed with telemedicine (audio/video) technology to reduce patient and provider exposure as well as to preserve personal protective equipment.   I connected with Holly Richardson by a video enabled telemedicine application and verified that I am speaking with the correct person using two identifiers. Location patient: Home Location provider: [facility], Office Persons participating in the virtual visit: Leilanie, Rauda, DO  [name], CMA acting as scribe for Dr. Briscoe Deutscher.   I discussed the limitations of evaluation and management by telemedicine and the availability of in person appointments. The patient expressed understanding and agreed to proceed.  Care Team   Patient Care Team: Briscoe Deutscher, DO as PCP - General (Family Medicine) Nahser, Wonda Cheng, MD as Consulting Physician (Cardiology) Irine Seal, MD as Attending Physician (Urology) Hollar, Katharine Look, MD as Referring Physician (Dermatology) Jerline Pain Mingo Amber, DO (Osteopathic Medicine) Linda Hedges, DO as Consulting Physician (Obstetrics and Gynecology) Gregary Cromer., DPM as Consulting Physician (Podiatry) Hiram Gash, MD as Consulting Physician (Orthopedic Surgery) Fonnie Mu, PT as Physical Therapist (Physical Therapy) Mayra Reel, [facility] as Counselor (Psychiatry)  Subjective:   HPI: Patient is doing well.   1. Primary osteoarthritis of both knees, Rx Celebrex   2. Bipolar 2 disorder ([place]), Rx Prozac and Lithium   3. Eating disorder in remission   4. Insulin resistance, improved   5. Hyperlipidemia LDL goal <130, Rx Zocor and fenofibrate   6. Essential hypertension, now controlled on no medication    Lab Results  Component Value Date   HGBA1C 5.5 01/22/2019   Lab Results  Component Value Date   LDLCALC 132 (H) 01/22/2019   CREATININE 0.55 01/22/2019   Wt Readings from Last  3 Encounters:  04/24/19 181 lb 6.4 oz (82.3 kg)  01/22/19 182 lb 6.1 oz (82.7 kg)  08/03/15 177 lb 6.4 oz (80.5 kg)   Review of Systems  Constitutional: Negative for fever.  HENT: Negative for hearing loss and tinnitus.   Eyes: Negative for blurred vision and double vision.  Respiratory: Negative for shortness of breath.   Cardiovascular: Negative for chest pain, palpitations and leg swelling.  Gastrointestinal: Negative for nausea and vomiting.  Genitourinary: Negative for dysuria, frequency and urgency.  Musculoskeletal: Negative for myalgias.  Neurological: Negative for dizziness and headaches.  Psychiatric/Behavioral: Negative for suicidal ideas.    Patient Active Problem List   Diagnosis Date Noted  . Bipolar 2 disorder ([place]), Rx Prozac and Lithium 01/24/2019  . Eating disorder in remission 01/24/2019  . Primary osteoarthritis of both knees, Rx Celebrex 11/28/2018  . Insulin resistance, improved 10/24/2016  . Cardiac murmur, unspecified 08/03/2015  . Essential hypertension, now controlled on no medication 05/31/2014  . Hyperlipidemia LDL goal <130, Rx Zocor and fenofibrate 05/31/2014  . Recurrent major depressive disorder, in partial remission ([place]) 05/31/2014    Social History   Tobacco Use  . Smoking status: Never Smoker  . Smokeless tobacco: Never Used  Substance Use Topics  . Alcohol use: Yes    Alcohol/week: 7.0 standard drinks    Types: 7 Standard drinks or equivalent per week    Current Outpatient Medications:  .  atorvastatin (LIPITOR) 10 MG tablet, Take 1 tablet (10 mg total) by mouth daily., Disp: 90 tablet, Rfl: 1 .  celecoxib (CELEBREX) 200 MG capsule, Take 1 capsule (200 mg total) by mouth 2 (two) times daily., Disp: 180 capsule,  Rfl: 1 .  Cholecalciferol (VITAMIN D-1000 MAX ST) 25 MCG (1000 UT) tablet, Take 1,000 Units by mouth daily. , Disp: , Rfl:  .  fenofibrate 160 MG tablet, Take 160 mg by mouth daily., Disp: , Rfl:  .  FLUoxetine (PROZAC) 20 MG  capsule, Take 40 mg by mouth daily. , Disp: , Rfl:  .  lithium carbonate 300 MG capsule, Take 300 mg by mouth 2 (two) times daily with a meal. , Disp: , Rfl:  .  valACYclovir (VALTREX) 1000 MG tablet, , Disp: , Rfl:   Allergies  Allergen Reactions  . Codeine     Headache    Objective:   VITALS: Per patient if applicable, see vitals. GENERAL: Alert, appears well and in no acute distress. HEENT: Atraumatic, conjunctiva clear, no obvious abnormalities on inspection of external nose and ears. NECK: Normal movements of the head and neck. CARDIOPULMONARY: No increased WOB. Speaking in clear sentences. I:E ratio WNL.  MS: Moves all visible extremities without noticeable abnormality. PSYCH: Pleasant and cooperative, well-groomed. Speech normal rate and rhythm. Affect is appropriate. Insight and judgement are appropriate. Attention is focused, linear, and appropriate.  NEURO: CN grossly intact. Oriented as arrived to appointment on time with no prompting. Moves both UE equally.  SKIN: No obvious lesions, wounds, erythema, or cyanosis noted on face or hands.  [facility] 04/24/2019 01/22/2019  Decreased Interest 0 2  Down, Depressed, Hopeless 0 2  PHQ - 2 Score 0 4  Altered sleeping 0 2  Tired, decreased energy 0 2  Change in appetite 0 2  Feeling bad or failure about yourself  0 2  Trouble concentrating 0 2  Moving slowly or fidgety/restless 0 2  Suicidal thoughts 0 2  PHQ-9 Score 0 18  Difficult doing work/chores Not difficult at all Somewhat difficult    Assessment and Plan:   Holly BlonderDenise was seen today for follow-up.  Diagnoses and all orders for this visit:  Primary osteoarthritis of both knees, Rx Celebrex  Bipolar 2 disorder (HCC), Rx Prozac and Lithium  Eating disorder in remission  Insulin resistance, improved  Hyperlipidemia LDL goal <130, Rx Zocor and fenofibrate  Essential hypertension, now controlled on no medication  Other orders -     celecoxib  (CELEBREX) 200 MG capsule; Take 1 capsule (200 mg total) by mouth 2 (two) times daily.   Marland Kitchen. COVID-19 Education: The signs and symptoms of COVID-19 were discussed with the patient and how to seek care for testing if needed. The importance of social distancing was discussed today. . Reviewed expectations re: course of current medical issues. . Discussed self-management of symptoms. . Outlined signs and symptoms indicating need for more acute intervention. . Patient verbalized understanding and all questions were answered. Marland Kitchen. Health Maintenance issues including appropriate healthy diet, exercise, and smoking avoidance were discussed with patient. . See orders for this visit as documented in the electronic medical record.  Helane RimaErica Kama Cammarano, DO  Records requested if needed. Time spent: 25 minutes, of which >50% was spent in obtaining information about her symptoms, reviewing her previous labs, evaluations, and treatments, counseling her about her condition (please see the discussed topics above), and developing a plan to further investigate it; she had a number of questions which I addressed.

## 2019-04-26 ENCOUNTER — Telehealth: Payer: Self-pay | Admitting: Family Medicine

## 2019-04-26 MED ORDER — CELECOXIB 200 MG PO CAPS: 200.0000 mg | ORAL_CAPSULE | Freq: Two times a day (BID) | ORAL | 1 refills | Status: DC

## 2019-04-26 NOTE — Telephone Encounter (Signed)
Rx has been sent today.  Patient informed.

## 2019-04-26 NOTE — Telephone Encounter (Signed)
Pt called stating her and Dr. Juleen China discussed celebrex on 04/24/2019. She has checked with [facility] [place] - [id number] but they have not received an RX. She said she's getting nervous with it being Friday afternoon. Please advise.

## 2019-04-30 ENCOUNTER — Telehealth: Payer: BC Managed Care – PPO | Admitting: Physician Assistant

## 2019-04-30 DIAGNOSIS — L282 Other prurigo: Secondary | ICD-10-CM

## 2019-04-30 NOTE — Progress Notes (Signed)
Based on what you shared with me, I feel your condition warrants further evaluation and I recommend that you be seen for a face to face office visit. Please call your primary care provider today to see if they can see you today.  Otherwise please proceed to one of the locations listed below for a hands on evaluation.   NOTE: If you entered your credit card information for this eVisit, you will not be charged. You may see a "hold" on your card for the $35 but that hold will drop off and you will not have a charge processed.  If you are having a true medical emergency please call 911.     For an urgent face to face visit, [facility] has five urgent care centers for your convenience:  ?  [email or web address] to reserve your spot online an avoid wait times  [facility] [address] [place], [place] [address] Modified hours of operation: Monday-Friday, 12 PM to 6 PM  Closed Saturday & Sunday  *Across the street from [place] (New Address!) [address], [place], [place] [address] *Just off [address], across the road from [place] hours of operation: Monday-Friday, 12 PM to 6 PM  Closed Saturday & Sunday   The following sites will take your insurance:  [facility]  [address] [place], [place] [address] 10 am to 8 pm Monday-Friday 12 pm to 8 pm [facility] at [facility]  [phone]                  Get Driving Directions  [address], [place], [place] [address] 8 am to 8 pm Monday-Friday 9 am to 6 pm Saturday 11 am to 6 pm Sunday   [facility] at [address] [place], [place] [address] 8 am to 8 pm Monday-Friday 8 am to 4 pm [facility] at [place]                    Get Driving Directions  [phone]  [address] [place], [place] [address]  Monday-Friday, 12 PM to 6 PM    Your e-visit answers were reviewed by a board certified advanced clinical practitioner to complete your personal care plan.  Thank you for using e-Visits.  ===View-only below this line===   ----- Message -----    From: Holly Richardson    Sent: 04/30/2019 10:56 AM EDT      To: E-Visit Mailing List Subject: Rash  Rash --------------------------------  Question: Where is your rash located? (check all that apply) Answer:   Legs or arms  Question: Is the rash is an area that tends to be moist or sweaty? Answer:   Yes  Question: Rash location "other"/comments: Answer:   Behind thigh  Question: Are you able to attach a photo? Please do if possible Answer:   Yes  Question: Do you have a fever? Answer:   No  Question: Does your rash itch? Answer:   Yes  Question: Did the rash appear after shaving or  other skin irritation? Answer:   No  Question: Is the rash painful? Answer:   No  Question: When did your symptoms start? Answer:   1-5 days ago  Question: Did your rash appear after sun exposure? Answer:   No  Question: Have you had a tick bite in the last 2 weeks? Answer:   No  Question: Did your rash appear shortly after eating a new food? Answer:   No  Question: Other new products used/comments: Answer:   I started using  Voltaren for my knee pain  Question: Have you tried any of the following over-the-counter medications? Answer:   No  Question: Over the counter med "other"/comments: Answer:     Question: Did any of the medications help? Answer:   No  Question: Are you experiencing any shortness of breath? Answer:   No  Question: Please list your medication allergies that you may have ? (If 'none' , please list as 'none') Answer:   Codeine  Question: Please list any  additional comments  Answer:

## 2019-05-02 ENCOUNTER — Encounter: Payer: Self-pay | Admitting: Family Medicine

## 2019-05-02 ENCOUNTER — Ambulatory Visit: Payer: BC Managed Care – PPO | Admitting: Family Medicine

## 2019-05-02 ENCOUNTER — Other Ambulatory Visit: Payer: Self-pay

## 2019-05-02 VITALS — BP 126/74 | HR 67 | Temp 97.8°F | Ht 64.25 in | Wt 183.0 lb

## 2019-05-02 DIAGNOSIS — R21 Rash and other nonspecific skin eruption: Secondary | ICD-10-CM

## 2019-05-02 MED ORDER — TRIAMCINOLONE ACETONIDE 0.5 % EX OINT: 1.0000 "application " | TOPICAL_OINTMENT | Freq: Two times a day (BID) | CUTANEOUS | 0 refills | Status: DC

## 2019-05-02 NOTE — Progress Notes (Signed)
   Chief Complaint:  Holly Richardson is a 58 y.o. female who presents for same day appointment with a chief complaint of rash.   Assessment/Plan:  Rash Most consistent with localized allergic reaction likely secondary to insect sting or bite.  The area is now quite excoriated making diagnosis difficult.  Could also represent psoriasis or fungal skin infection, however less likely given what appears to be bite marks at the center of the lesion. Does not have any signs of cellulitis.  Will start topical triamcinolone twice daily for the next week.  Discussed warning signs and reasons to return to care.  Follow-up as needed.     Subjective:  HPI:  Rash, acute problem Started a few days ago.  Located on right posterior thigh.  Patient states that she was sitting on her deck when she noticed itching to the area.  She was wearing jeans at the time and does not know any specific inciting event.  Over last couple of days symptoms have improved a little bit.  She had a E-visit 2 days ago and was advised to follow-up with her PCP.  She has not tried anything for the rash.  Area is very pruritic.  Mildly painful.  No fevers or chills.  No drainage.No other obvious alleviating or aggravating factors.    ROS: Per HPI  PMH: She reports that she has never smoked. She has never used smokeless tobacco. She reports current alcohol use of about 7.0 standard drinks of alcohol per week. She reports that she does not use drugs.      Objective:  Physical Exam: BP 126/74 (BP Location: Left Arm, Patient Position: Sitting, Cuff Size: Normal)   Pulse 67   Temp 97.8 F (36.6 C) (Oral)   Ht 5' 4.25" (1.632 m)   Wt 183 lb (83 kg)   SpO2 96%   BMI 31.17 kg/m   Gen: NAD, resting comfortably Skin:  Approximately 3 x 2 oval shaped lesion with 2 small centra eschars and several overlying excoriations on right posterior thigh.  Area is well demarcated.     Algis Greenhouse. Jerline Pain, MD 05/02/2019 8:56 AM

## 2019-05-20 ENCOUNTER — Ambulatory Visit: Payer: BC Managed Care – PPO | Admitting: Family Medicine

## 2019-05-20 ENCOUNTER — Other Ambulatory Visit: Payer: Self-pay

## 2019-05-20 ENCOUNTER — Encounter: Payer: Self-pay | Admitting: Family Medicine

## 2019-05-20 VITALS — BP 148/82 | HR 64 | Temp 97.8°F | Ht 64.25 in | Wt 178.7 lb

## 2019-05-20 DIAGNOSIS — S61011A Laceration without foreign body of right thumb without damage to nail, initial encounter: Secondary | ICD-10-CM

## 2019-05-20 NOTE — Progress Notes (Signed)
  Subjective:     Patient ID: Holly Richardson, female   DOB: July 28, 1961, 58 y.o.   MRN: [id number]  HPI   Patient is seen with laceration right thumb.  She was doing dishes last night around 8:30 when she accidentally cut this with a ceramic type knife in the dishwasher.  She noted a lot of bleeding afterwards which was controlled with pressure.  She has had some intermittent bleeding today.  She had a tetanus booster March 2019.  Very small superficial nick of the index finger as well.   Past Medical History:  Diagnosis Date  . Anorexia 1982  . Anxiety   . Arthritis   . Depression   . Heart murmur   . History of chicken pox   . Hyperlipidemia   . Hypertension   . Kidney stones   . Vaginal delivery 1992   Past Surgical History:  Procedure Laterality Date  . ABDOMINAL SURGERY    . APPENDECTOMY    . Bladder Tack    . BREAST BIOPSY Left 2015  . CESAREAN SECTION  1994  . TONSILLECTOMY AND ADENOIDECTOMY      reports that she has never smoked. She has never used smokeless tobacco. She reports current alcohol use of about 7.0 standard drinks of alcohol per week. She reports that she does not use drugs. family history includes Arthritis in her mother; CAD in her mother; Cystic fibrosis in her son; Heart disease in her father and mother; Hypertension in her father and mother. Allergies  Allergen Reactions  . Codeine     Headache     Review of Systems  Constitutional: Negative for chills and fever.       Objective:   Physical Exam Constitutional:      Appearance: Normal appearance.  Skin:    Comments: Right thumb reveals approximately 1 and 1/2 cm laceration which is slightly gaping extending from the lateral surface near the nail to the volar surface.  There is no penetration into the nail.  Minimal bleeding.  No foreign bodies noted.  Neurological:     Mental Status: She is alert.        Assessment:     Laceration right thumb    Plan:     -Injury did occur  several hours ago but was very clean type cut and she has kept this covered since then.  Because of high risk of tearing this back open or delayed healing we gave option of going ahead and closing with few sutures and patient consented.  -We prepped the skin with Betadine.  Using 1% plain Xylocaine performed a digital block on the thumb.  The wound was copiously irrigated with sterile normal saline.  Wound is closed with 3 sutures of 5-0 Ethilon.  Minimal bleeding.  Topical antibiotic and dressing applied  -Wound care instructions given.  Keep dry for 24 hours then clean daily with soap and water.  Continue topical antibiotic or Vaseline and keep covered until follow-up.  Suture removal in 7 to 9 days.  Follow-up sooner for any signs of secondary infection  Eulas Post MD [facility]

## 2019-05-20 NOTE — Patient Instructions (Signed)
Keep wound dry for the first 24 hours then clean daily with soap and water for one week. Apply topical antibiotic or vaseline daily for 3-4 days. Keep covered with clean dressing until follow up. . Follow up promptly for any signs of infection such as redness, warmth, pain, or drainage. We need to get sutures out in 7 to 9 days.

## 2019-07-03 ENCOUNTER — Encounter: Payer: Self-pay | Admitting: Family Medicine

## 2019-07-03 ENCOUNTER — Other Ambulatory Visit: Payer: Self-pay | Admitting: Obstetrics & Gynecology

## 2019-07-03 DIAGNOSIS — Z1231 Encounter for screening mammogram for malignant neoplasm of breast: Secondary | ICD-10-CM

## 2019-07-08 ENCOUNTER — Other Ambulatory Visit: Payer: Self-pay

## 2019-07-08 MED ORDER — FENOFIBRATE 160 MG PO TABS: 160.0000 mg | ORAL_TABLET | Freq: Every day | ORAL | 1 refills | Status: DC

## 2019-07-08 NOTE — Telephone Encounter (Signed)
I called Holly Richardson and informed her of Dr. Alcario Drought message.  Holly Richardson just needed the one medication refilled.  She scheduled a follow up visit with Dr. Juleen China in 2 weeks.

## 2019-07-30 ENCOUNTER — Encounter: Payer: Self-pay | Admitting: Family Medicine

## 2019-07-30 ENCOUNTER — Ambulatory Visit: Payer: BC Managed Care – PPO | Admitting: Family Medicine

## 2019-07-30 ENCOUNTER — Other Ambulatory Visit: Payer: Self-pay

## 2019-07-30 VITALS — BP 130/70 | HR 69 | Temp 97.8°F | Ht 65.0 in | Wt 179.0 lb

## 2019-07-30 DIAGNOSIS — G8929 Other chronic pain: Secondary | ICD-10-CM | POA: Insufficient documentation

## 2019-07-30 DIAGNOSIS — E785 Hyperlipidemia, unspecified: Secondary | ICD-10-CM | POA: Diagnosis not present

## 2019-07-30 DIAGNOSIS — E88819 Insulin resistance, unspecified: Secondary | ICD-10-CM

## 2019-07-30 DIAGNOSIS — I10 Essential (primary) hypertension: Secondary | ICD-10-CM | POA: Diagnosis not present

## 2019-07-30 DIAGNOSIS — E8881 Metabolic syndrome: Secondary | ICD-10-CM | POA: Diagnosis not present

## 2019-07-30 DIAGNOSIS — M25561 Pain in right knee: Secondary | ICD-10-CM

## 2019-07-30 DIAGNOSIS — F3181 Bipolar II disorder: Secondary | ICD-10-CM

## 2019-07-30 DIAGNOSIS — F509 Eating disorder, unspecified: Secondary | ICD-10-CM

## 2019-07-30 DIAGNOSIS — M170 Bilateral primary osteoarthritis of knee: Secondary | ICD-10-CM

## 2019-07-30 DIAGNOSIS — R011 Cardiac murmur, unspecified: Secondary | ICD-10-CM

## 2019-07-30 LAB — CBC WITH DIFFERENTIAL/PLATELET
Basophils Absolute: 0 10*3/uL (ref 0.0–0.1)
Basophils Relative: 0.2 % (ref 0.0–3.0)
Eosinophils Absolute: 0.1 10*3/uL (ref 0.0–0.7)
Eosinophils Relative: 1.4 % (ref 0.0–5.0)
HCT: 39.7 % (ref 36.0–46.0)
Hemoglobin: 13.4 g/dL (ref 12.0–15.0)
Lymphocytes Relative: 27.9 % (ref 12.0–46.0)
Lymphs Abs: 1.7 10*3/uL (ref 0.7–4.0)
MCHC: 33.8 g/dL (ref 30.0–36.0)
MCV: 89.9 fl (ref 78.0–100.0)
Monocytes Absolute: 0.5 10*3/uL (ref 0.1–1.0)
Monocytes Relative: 8.9 % (ref 3.0–12.0)
Neutro Abs: 3.7 10*3/uL (ref 1.4–7.7)
Neutrophils Relative %: 61.6 % (ref 43.0–77.0)
Platelets: 284 10*3/uL (ref 150.0–400.0)
RBC: 4.42 Mil/uL (ref 3.87–5.11)
RDW: 12.9 % (ref 11.5–15.5)
WBC: 6.1 10*3/uL (ref 4.0–10.5)

## 2019-07-30 LAB — COMPREHENSIVE METABOLIC PANEL
ALT: 16 U/L (ref 0–35)
AST: 18 U/L (ref 0–37)
Albumin: 4.5 g/dL (ref 3.5–5.2)
Alkaline Phosphatase: 66 U/L (ref 39–117)
BUN: 17 mg/dL (ref 6–23)
CO2: 24 mEq/L (ref 19–32)
Calcium: 9.9 mg/dL (ref 8.4–10.5)
Chloride: 106 mEq/L (ref 96–112)
Creatinine, Ser: 0.68 mg/dL (ref 0.40–1.20)
GFR: 89.54 mL/min (ref >60.00)
Glucose, Bld: 105 mg/dL — ABNORMAL HIGH (ref 70–99)
Potassium: 4.4 mEq/L (ref 3.5–5.1)
Sodium: 140 mEq/L (ref 135–145)
Total Bilirubin: 0.6 mg/dL (ref 0.2–1.2)
Total Protein: 6.5 g/dL (ref 6.0–8.3)

## 2019-07-30 LAB — LIPID PANEL
Cholesterol: 179 mg/dL (ref 0–200)
HDL: 45.9 mg/dL (ref >39.00)
NonHDL: 132.69
Total CHOL/HDL Ratio: 4
Triglycerides: 285 mg/dL — ABNORMAL HIGH (ref 0.0–149.0)
VLDL: 57 mg/dL — ABNORMAL HIGH (ref 0.0–40.0)

## 2019-07-30 LAB — LDL CHOLESTEROL, DIRECT: Direct LDL: 94 mg/dL

## 2019-07-30 LAB — HEMOGLOBIN A1C: Hgb A1c MFr Bld: 5.5 % (ref 4.6–6.5)

## 2019-07-30 MED ORDER — VALACYCLOVIR HCL 1 G PO TABS: 2000.0000 mg | ORAL_TABLET | Freq: Two times a day (BID) | ORAL | 0 refills | Status: DC

## 2019-07-30 NOTE — Progress Notes (Signed)
Holly Richardson is a 58 y.o. female is here for follow up.  History of Present Illness:   Holly Richardson, CMA acting as scribe for Dr. Briscoe Richardson.   HPI: Patient in for medication follow up. No new questions or problems. Doing well. Tolerating medications without side effects. Due for labs and refills. 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 Walking more with her husband. Right knee pain, worse when going downhill. Reconnecting with husband as empty-nesters.   Health Maintenance Due  Topic Date Due  . Hepatitis C Screening  [birth year]  . HIV Screening  09/07/1978  . PAP SMEAR-Modifier  09/07/1984  . COLONOSCOPY  09/07/2013  . MAMMOGRAM  04/13/2019   Depression screen [facility] 2/9 07/30/2019 04/24/2019 01/22/2019  Decreased Interest 0 0 2  Down, Depressed, Hopeless 0 0 2  PHQ - 2 Score 0 0 4  Altered sleeping 0 0 2  Tired, decreased energy 0 0 2  Change in appetite 0 0 2  Feeling bad or failure about yourself  0 0 2  Trouble concentrating 0 0 2  Moving slowly or fidgety/restless 0 0 2  Suicidal thoughts 0 0 2  PHQ-9 Score 0 0 18  Difficult doing work/chores - Not difficult at all Somewhat difficult   PMHx, SurgHx, SocialHx, FamHx, Medications, and Allergies were reviewed in the Visit Navigator and updated as appropriate.   Patient Active Problem List   Diagnosis Date Noted  . Chronic pain of right knee 07/30/2019  . Bipolar 2 disorder (Holly Richardson), Rx Prozac and Lithium 01/24/2019  . Eating disorder in remission, Hx of anorexia as a teen, struggles with emotional and binge eating as an adult, followed by Psych, RD; Intuitive Eating 01/24/2019  . Primary osteoarthritis of both knees, Rx Celebrex 11/28/2018  . Insulin resistance, diet-controlled 10/24/2016  . Cardiac murmur, 2016 ECHO: LVEF 60-65%, aortic valve sclerosis, trivial TR, ASCVD 2.8% 08/03/2015  . Essential hypertension, now controlled on no medication 05/31/2014  .  Hyperlipidemia LDL goal <130, Rx Zocor and Fenofibrate 05/31/2014  . Recurrent major depressive disorder, in partial remission (Holly Richardson) 05/31/2014   Social History   Tobacco Use  . Smoking status: Never Smoker  . Smokeless tobacco: Never Used  Substance Use Topics  . Alcohol use: Yes    Alcohol/week: 7.0 standard drinks    Types: 7 Standard drinks or equivalent per week  . Drug use: No   Current Medications and Allergies   Current Outpatient Medications:  .  atorvastatin (LIPITOR) 10 MG tablet, Take 1 tablet (10 mg total) by mouth daily., Disp: 90 tablet, Rfl: 1 .  celecoxib (CELEBREX) 200 MG capsule, Take 1 capsule (200 mg total) by mouth 2 (two) times daily., Disp: 180 capsule, Rfl: 1 .  Cholecalciferol (VITAMIN D-1000 MAX ST) 25 MCG (1000 UT) tablet, Take 1,000 Units by mouth daily. , Disp: , Rfl:  .  fenofibrate 160 MG tablet, Take 1 tablet (160 mg total) by mouth daily., Disp: 30 tablet, Rfl: 1 .  FLUoxetine (PROZAC) 40 MG capsule, Take 1 capsule by mouth daily., Disp: , Rfl:  .  lithium carbonate 300 MG capsule, Take 300 mg by mouth 2 (two) times daily with a meal. , Disp: , Rfl:  .  valACYclovir (VALTREX) 1000 MG tablet, Take 2 tablets (2,000 mg total) by mouth 2 (two) times daily., Disp: 8 tablet, Rfl: 0   Allergies  Allergen Reactions  . Codeine     Headache   Review  of Systems   Pertinent items are noted in the HPI. Otherwise, a complete ROS is negative.  Vitals   Vitals:   07/30/19 0812  BP: 130/70  Pulse: 69  Temp: 97.8 F (36.6 C)  TempSrc: Temporal  SpO2: 96%  Weight: 179 lb (81.2 kg)  Height: 5\' 5"  (1.651 m)     Body mass index is 29.79 kg/m.  Physical Exam   Physical Exam Vitals signs and nursing note reviewed.  HENT:     Head: Normocephalic and atraumatic.  Eyes:     Pupils: Pupils are equal, round, and reactive to light.  Neck:     Musculoskeletal: Normal range of motion and neck supple.  Cardiovascular:     Rate and Rhythm: Normal rate and  regular rhythm.     Heart sounds: Normal heart sounds.  Pulmonary:     Effort: Pulmonary effort is normal.  Abdominal:     Palpations: Abdomen is soft.  Skin:    General: Skin is warm.  Psychiatric:        Behavior: Behavior normal.    Assessment and Plan   Holly Richardson was seen today for follow-up.  Diagnoses and all orders for this visit:  Hyperlipidemia LDL goal <130, Rx Zocor and fenofibrate -     Comprehensive metabolic panel -     Lipid panel  Essential hypertension, now controlled on no medication  Bipolar 2 disorder (HCC), Rx Prozac and Lithium -     CBC with Differential/Platelet  Eating disorder in remission  Primary osteoarthritis of both knees, Rx Celebrex -     valACYclovir (VALTREX) 1000 MG tablet; Take 2 tablets (2,000 mg total) by mouth 2 (two) times daily.  Insulin resistance, improved -     Hemoglobin A1c  Chronic pain of right knee  Cardiac murmur, 2016 ECHO: LVEF 60-65%, aortic valve sclerosis, trivial TR, ASCVD 2.8%   . Orders and follow up as documented in EpicCare, reviewed diet, exercise and weight control, cardiovascular risk and specific lipid/LDL goals reviewed, reviewed medications and side effects in detail.  . Reviewed expectations re: course of current medical issues. . Outlined signs and symptoms indicating need for more acute intervention. . Patient verbalized understanding and all questions were answered. . Patient received an After Visit Summary.  CMA served as 2017 during this visit. History, Physical, and Plan performed by medical provider. The above documentation has been reviewed and is accurate and complete. Neurosurgeon, D.O.  Holly Rima, DO [place] 07/30/2019

## 2019-08-13 ENCOUNTER — Ambulatory Visit
Admission: RE | Admit: 2019-08-13 | Discharge: 2019-08-13 | Disposition: A | Discharge status: Home / Self Care | Payer: BC Managed Care – PPO | Source: Ambulatory Visit | Attending: Obstetrics & Gynecology | Admitting: Obstetrics & Gynecology

## 2019-08-13 ENCOUNTER — Other Ambulatory Visit: Payer: Self-pay

## 2019-08-13 DIAGNOSIS — Z1231 Encounter for screening mammogram for malignant neoplasm of breast: Secondary | ICD-10-CM

## 2019-09-03 ENCOUNTER — Other Ambulatory Visit: Payer: Self-pay | Admitting: Family Medicine

## 2019-09-30 ENCOUNTER — Encounter: Payer: Self-pay | Admitting: Physician Assistant

## 2019-09-30 DIAGNOSIS — K589 Irritable bowel syndrome without diarrhea: Secondary | ICD-10-CM | POA: Insufficient documentation

## 2019-09-30 DIAGNOSIS — K580 Irritable bowel syndrome with diarrhea: Secondary | ICD-10-CM

## 2019-09-30 HISTORY — DX: Irritable bowel syndrome with diarrhea: K58.0

## 2019-10-28 ENCOUNTER — Other Ambulatory Visit: Payer: Self-pay

## 2019-10-28 NOTE — Progress Notes (Signed)
Holly Richardson is a 58 y.o. female here for a new problem.   History of Present Illness:   Chief Complaint  Patient presents with  . Transfer of Care    HPI  Patient presents for Transfer of Care.  HLD Currently on Lipitor 10 mg daily and Fenofibrate 160 mg daily. She is tolerating this well and has been on for at least one year.   LE cramps Has noticed cramps in b/l feet and calves over the past few weeks. Have been in b/l hands as well. If they are in her feet they can wake her up at night.She has been walking more, wears her [employer] when she does walk. Doesn't feel like she is drinking enough water. Denies: calf swelling, erythema, point tenderness.    Past Medical History:  Diagnosis Date  . Anorexia 1982  . Anxiety   . Arthritis   . Depression   . Heart murmur   . History of chicken pox   . Hyperlipidemia   . Hypertension   . Kidney stones   . Vaginal delivery 1992     Social History   Socioeconomic History  . Marital status: Married    Spouse name: Not on file  . Number of children: Not on file  . Years of education: Not on file  . Highest education level: Not on file  Occupational History    Employer: [employer]  Tobacco Use  . Smoking status: Never Smoker  . Smokeless tobacco: Never Used  Substance and Sexual Activity  . Alcohol use: Yes    Alcohol/week: 7.0 standard drinks    Types: 7 Standard drinks or equivalent per week  . Drug use: No  . Sexual activity: Not Currently  Other Topics Concern  . Not on file  Social History Narrative   [name]   Married   2 sons -- Facilities manager (cystic fibrosis)    Social Determinants of Health   Financial Resource Strain:   . Difficulty of Paying Living Expenses: Not on file  Food Insecurity:   . Worried About Programme researcher, broadcasting/film/video in the Last Year: Not on file  . Ran Out of Food in the Last Year: Not on file  Transportation Needs:   . Lack of  Transportation (Medical): Not on file  . Lack of Transportation (Non-Medical): Not on file  Physical Activity:   . Days of Exercise per Week: Not on file  . Minutes of Exercise per Session: Not on file  Stress:   . Feeling of Stress : Not on file  Social Connections:   . Frequency of Communication with Friends and Family: Not on file  . Frequency of Social Gatherings with Friends and Family: Not on file  . Attends Religious Services: Not on file  . Active Member of Clubs or Organizations: Not on file  . Attends Banker Meetings: Not on file  . Marital Status: Not on file  Intimate Partner Violence:   . Fear of Current or Ex-Partner: Not on file  . Emotionally Abused: Not on file  . Physically Abused: Not on file  . Sexually Abused: Not on file    Past Surgical History:  Procedure Laterality Date  . ABDOMINAL SURGERY    . APPENDECTOMY    . Bladder Tack    . BREAST BIOPSY Left 2015  . CESAREAN SECTION  1994  . TONSILLECTOMY AND ADENOIDECTOMY      Family History  Problem  Relation Age of Onset  . CAD Mother   . Arthritis Mother   . Heart disease Mother   . Hypertension Mother   . Hypertension Father   . Heart disease Father   . Cystic fibrosis Son     Allergies  Allergen Reactions  . Codeine     Headache    Current Medications:   Current Outpatient Medications:  .  atorvastatin (LIPITOR) 10 MG tablet, TAKE 1 TABLET BY MOUTH DAILY, Disp: 90 tablet, Rfl: 0 .  celecoxib (CELEBREX) 200 MG capsule, Take 1 capsule (200 mg total) by mouth 2 (two) times daily., Disp: 180 capsule, Rfl: 1 .  Cholecalciferol (VITAMIN D-1000 MAX ST) 25 MCG (1000 UT) tablet, Take 1,000 Units by mouth daily. , Disp: , Rfl:  .  fenofibrate 160 MG tablet, Take 1 tablet (160 mg total) by mouth daily., Disp: 30 tablet, Rfl: 1 .  FLUoxetine (PROZAC) 40 MG capsule, Take 1 capsule by mouth daily., Disp: , Rfl:  .  lithium carbonate 300 MG capsule, Take 300 mg by mouth 2 (two) times daily  with a meal. , Disp: , Rfl:  .  valACYclovir (VALTREX) 1000 MG tablet, Take 2 tablets (2,000 mg total) by mouth 2 (two) times daily., Disp: 8 tablet, Rfl: 0   Review of Systems:   ROS  Negative unless otherwise specified per HPI.  Vitals:   Vitals:   10/29/19 0821  BP: 128/72  Pulse: 68  Temp: (!) 97.2 F (36.2 C)  TempSrc: Temporal  SpO2: 96%  Weight: 182 lb (82.6 kg)  Height: 5\' 5"  (1.651 m)     Body mass index is 30.29 kg/m.  Physical Exam:   Physical Exam Vitals and nursing note reviewed.  Constitutional:      General: She is not in acute distress.    Appearance: She is well-developed. She is not ill-appearing or toxic-appearing.  Cardiovascular:     Rate and Rhythm: Normal rate and regular rhythm.     Pulses: Normal pulses.     Heart sounds: Normal heart sounds, S1 normal and S2 normal.     Comments: No LE edema Pulmonary:     Effort: Pulmonary effort is normal.     Breath sounds: Normal breath sounds.  Musculoskeletal:     Comments: Bilateral legs without swelling, erythema  Skin:    General: Skin is warm and dry.  Neurological:     Mental Status: She is alert.     GCS: GCS eye subscore is 4. GCS verbal subscore is 5. GCS motor subscore is 6.  Psychiatric:        Speech: Speech normal.        Behavior: Behavior normal. Behavior is cooperative.       Assessment and Plan:   Ilisha was seen today for transfer of care.  Diagnoses and all orders for this visit:  Hyperlipidemia LDL goal <130 Recheck lipid panel today and will reassess current statin therapy/dosage. Follow-up in 3 months. -     Lipid panel  Cramp in lower leg Will update lytes, iron, and CK for further evaluation and treatment if indicated. Discussed need for adequate hydration throughout the day. -     Comprehensive metabolic panel -     CBC with Differential/Platelet -     CK (Creatine Kinase) -     Iron, TIBC and Ferritin Panel  . Reviewed expectations re: course of current  medical issues. . Discussed self-management of symptoms. . Outlined signs and symptoms indicating need  for more acute intervention. . Patient verbalized understanding and all questions were answered. . See orders for this visit as documented in the electronic medical record. . Patient received an After-Visit Summary.  Jarold MottoSamantha Angenette Daily, PA-C

## 2019-10-29 ENCOUNTER — Encounter: Payer: Self-pay | Admitting: Physician Assistant

## 2019-10-29 ENCOUNTER — Ambulatory Visit (INDEPENDENT_AMBULATORY_CARE_PROVIDER_SITE_OTHER): Payer: BC Managed Care – PPO | Admitting: Physician Assistant

## 2019-10-29 VITALS — BP 128/72 | HR 68 | Temp 97.2°F | Ht 65.0 in | Wt 182.0 lb

## 2019-10-29 DIAGNOSIS — R252 Cramp and spasm: Secondary | ICD-10-CM | POA: Diagnosis not present

## 2019-10-29 DIAGNOSIS — E785 Hyperlipidemia, unspecified: Secondary | ICD-10-CM | POA: Diagnosis not present

## 2019-10-29 LAB — CBC WITH DIFFERENTIAL/PLATELET
Basophils Absolute: 0 10*3/uL (ref 0.0–0.1)
Basophils Relative: 0.1 % (ref 0.0–3.0)
Eosinophils Absolute: 0.1 10*3/uL (ref 0.0–0.7)
Eosinophils Relative: 1.5 % (ref 0.0–5.0)
HCT: 41.3 % (ref 36.0–46.0)
Hemoglobin: 13.9 g/dL (ref 12.0–15.0)
Lymphocytes Relative: 29.5 % (ref 12.0–46.0)
Lymphs Abs: 1.6 10*3/uL (ref 0.7–4.0)
MCHC: 33.6 g/dL (ref 30.0–36.0)
MCV: 91.6 fl (ref 78.0–100.0)
Monocytes Absolute: 0.4 10*3/uL (ref 0.1–1.0)
Monocytes Relative: 8.2 % (ref 3.0–12.0)
Neutro Abs: 3.3 10*3/uL (ref 1.4–7.7)
Neutrophils Relative %: 60.7 % (ref 43.0–77.0)
Platelets: 304 10*3/uL (ref 150.0–400.0)
RBC: 4.51 Mil/uL (ref 3.87–5.11)
RDW: 13.1 % (ref 11.5–15.5)
WBC: 5.5 10*3/uL (ref 4.0–10.5)

## 2019-10-29 LAB — LIPID PANEL
Cholesterol: 202 mg/dL — ABNORMAL HIGH (ref 0–200)
HDL: 53.3 mg/dL (ref >39.00)
LDL Cholesterol: 113 mg/dL — ABNORMAL HIGH (ref 0–99)
NonHDL: 149.08
Total CHOL/HDL Ratio: 4
Triglycerides: 182 mg/dL — ABNORMAL HIGH (ref 0.0–149.0)
VLDL: 36.4 mg/dL (ref 0.0–40.0)

## 2019-10-29 LAB — COMPREHENSIVE METABOLIC PANEL
ALT: 17 U/L (ref 0–35)
AST: 17 U/L (ref 0–37)
Albumin: 4.7 g/dL (ref 3.5–5.2)
Alkaline Phosphatase: 73 U/L (ref 39–117)
BUN: 14 mg/dL (ref 6–23)
CO2: 25 mEq/L (ref 19–32)
Calcium: 9.7 mg/dL (ref 8.4–10.5)
Chloride: 107 mEq/L (ref 96–112)
Creatinine, Ser: 0.62 mg/dL (ref 0.40–1.20)
GFR: 99.52 mL/min (ref >60.00)
Glucose, Bld: 105 mg/dL — ABNORMAL HIGH (ref 70–99)
Potassium: 5 mEq/L (ref 3.5–5.1)
Sodium: 141 mEq/L (ref 135–145)
Total Bilirubin: 0.4 mg/dL (ref 0.2–1.2)
Total Protein: 6.8 g/dL (ref 6.0–8.3)

## 2019-10-29 LAB — CK: Total CK: 106 U/L (ref 7–177)

## 2019-10-29 NOTE — Patient Instructions (Signed)
It was great to see you!  We will be in touch with your lab results and any further recommendations.  Push fluids.   Let's follow-up in 3 months, sooner if you have concerns.  Take care,  [name] PA-C

## 2019-10-30 LAB — IRON,TIBC AND FERRITIN PANEL
%SAT: 25 % (calc) (ref 16–45)
Ferritin: 262 ng/mL — ABNORMAL HIGH (ref 16–232)
Iron: 91 ug/dL (ref 45–160)
TIBC: 366 mcg/dL (calc) (ref 250–450)

## 2019-11-18 LAB — HM COLONOSCOPY

## 2019-11-23 ENCOUNTER — Encounter: Payer: Self-pay | Admitting: Physician Assistant

## 2019-11-28 ENCOUNTER — Other Ambulatory Visit: Payer: Self-pay | Admitting: Family Medicine

## 2019-12-03 NOTE — Telephone Encounter (Signed)
Needs app

## 2019-12-03 NOTE — Telephone Encounter (Signed)
Please review

## 2019-12-04 NOTE — Telephone Encounter (Signed)
Patient was seen on 12/22 for a toc with [facility]

## 2020-01-30 ENCOUNTER — Ambulatory Visit: Payer: BC Managed Care – PPO | Admitting: Physician Assistant

## 2020-02-11 ENCOUNTER — Other Ambulatory Visit: Payer: Self-pay

## 2020-02-11 ENCOUNTER — Encounter: Payer: Self-pay | Admitting: Physician Assistant

## 2020-02-11 ENCOUNTER — Ambulatory Visit (INDEPENDENT_AMBULATORY_CARE_PROVIDER_SITE_OTHER): Payer: BC Managed Care – PPO | Admitting: Physician Assistant

## 2020-02-11 VITALS — BP 130/86 | HR 69 | Ht 65.0 in | Wt 184.4 lb

## 2020-02-11 DIAGNOSIS — R1013 Epigastric pain: Secondary | ICD-10-CM

## 2020-02-11 DIAGNOSIS — Z114 Encounter for screening for human immunodeficiency virus [HIV]: Secondary | ICD-10-CM

## 2020-02-11 DIAGNOSIS — E785 Hyperlipidemia, unspecified: Secondary | ICD-10-CM

## 2020-02-11 DIAGNOSIS — Z79899 Other long term (current) drug therapy: Secondary | ICD-10-CM | POA: Diagnosis not present

## 2020-02-11 DIAGNOSIS — Z1159 Encounter for screening for other viral diseases: Secondary | ICD-10-CM

## 2020-02-11 LAB — LDL CHOLESTEROL, DIRECT: Direct LDL: 131 mg/dL

## 2020-02-11 LAB — COMPREHENSIVE METABOLIC PANEL
ALT: 12 U/L (ref 0–35)
AST: 13 U/L (ref 0–37)
Albumin: 4.4 g/dL (ref 3.5–5.2)
Alkaline Phosphatase: 65 U/L (ref 39–117)
BUN: 14 mg/dL (ref 6–23)
CO2: 25 mEq/L (ref 19–32)
Calcium: 9.6 mg/dL (ref 8.4–10.5)
Chloride: 108 mEq/L (ref 96–112)
Creatinine, Ser: 0.64 mg/dL (ref 0.40–1.20)
GFR: 95.84 mL/min (ref >60.00)
Glucose, Bld: 109 mg/dL — ABNORMAL HIGH (ref 70–99)
Potassium: 4.4 mEq/L (ref 3.5–5.1)
Sodium: 140 mEq/L (ref 135–145)
Total Bilirubin: 0.5 mg/dL (ref 0.2–1.2)
Total Protein: 6.2 g/dL (ref 6.0–8.3)

## 2020-02-11 LAB — CBC WITH DIFFERENTIAL/PLATELET
Basophils Absolute: 0 10*3/uL (ref 0.0–0.1)
Basophils Relative: 0.2 % (ref 0.0–3.0)
Eosinophils Absolute: 0.1 10*3/uL (ref 0.0–0.7)
Eosinophils Relative: 1.9 % (ref 0.0–5.0)
HCT: 38.6 % (ref 36.0–46.0)
Hemoglobin: 13.1 g/dL (ref 12.0–15.0)
Lymphocytes Relative: 31 % (ref 12.0–46.0)
Lymphs Abs: 1.8 10*3/uL (ref 0.7–4.0)
MCHC: 34 g/dL (ref 30.0–36.0)
MCV: 92.1 fl (ref 78.0–100.0)
Monocytes Absolute: 0.5 10*3/uL (ref 0.1–1.0)
Monocytes Relative: 8.3 % (ref 3.0–12.0)
Neutro Abs: 3.4 10*3/uL (ref 1.4–7.7)
Neutrophils Relative %: 58.6 % (ref 43.0–77.0)
Platelets: 270 10*3/uL (ref 150.0–400.0)
RBC: 4.19 Mil/uL (ref 3.87–5.11)
RDW: 13 % (ref 11.5–15.5)
WBC: 5.8 10*3/uL (ref 4.0–10.5)

## 2020-02-11 LAB — LIPID PANEL
Cholesterol: 210 mg/dL — ABNORMAL HIGH (ref 0–200)
HDL: 44.8 mg/dL (ref >39.00)
NonHDL: 165.41
Total CHOL/HDL Ratio: 5
Triglycerides: 246 mg/dL — ABNORMAL HIGH (ref 0.0–149.0)
VLDL: 49.2 mg/dL — ABNORMAL HIGH (ref 0.0–40.0)

## 2020-02-11 LAB — H. PYLORI ANTIBODY, IGG: H Pylori IgG: POSITIVE — AB

## 2020-02-11 LAB — LIPASE: Lipase: 25 U/L (ref 11.0–59.0)

## 2020-02-11 NOTE — Progress Notes (Signed)
Holly Richardson is a 59 y.o. female is here for follow up.  I acted as a Education administrator for [employer], PA-C Anselmo Pickler, LPN  History of Present Illness:   Chief Complaint  Patient presents with  . Hyperlipidemia  . Back Pain    HPI   Hyperlipidemia Pt following up today, currently taking Lipitor 10 mg daily and Fenofibrate 160 mg daily. Denies leg cramps, tolerating medication well.    Back pain Pt c/o pain right mid upper back x 3-4 days. Has not taken anything for the pain. Pt thinks constipated, she is seeing a dietician to help with IBS. She has had this pain before when she has had constipation. Feels like she has "two weeks worth of stool" in her. Has a history of kidney stones. Feels constipation. Takes citrucel when she feels constipated but hasn't recently. Hasn't been able to eat certain foods without abdominal pain -- such as salads. She drinks 2-3 cocktails daily. Recently had colonoscopy where she had internal hemorrhoids and diverticulosis. Pain sometimes gets so bad that she doubles over.  Lithium labs Needs updated lithium labs for psychiatry while here.    Health Maintenance Due  Topic Date Due  . Hepatitis C Screening  Never done  . HIV Screening  Never done    Past Medical History:  Diagnosis Date  . Anorexia 1982  . Anxiety   . Arthritis   . Depression   . Heart murmur   . History of chicken pox   . Hyperlipidemia   . Hypertension   . Kidney stones   . Vaginal delivery 1992     Social History   Socioeconomic History  . Marital status: Married    Spouse name: Not on file  . Number of children: Not on file  . Years of education: Not on file  . Highest education level: Not on file  Occupational History    Employer: [employer]  Tobacco Use  . Smoking status: Never Smoker  . Smokeless tobacco: Never Used  Substance and Sexual Activity  . Alcohol use: Yes    Alcohol/week: 7.0 standard drinks    Types: 7 Standard  drinks or equivalent per week  . Drug use: No  . Sexual activity: Not Currently  Other Topics Concern  . Not on file  Social History Narrative   [name]   Married   2 sons -- Museum/gallery exhibitions officer (cystic fibrosis)    Social Determinants of Health   Financial Resource Strain:   . Difficulty of Paying Living Expenses:   Food Insecurity:   . Worried About Charity fundraiser in the Last Year:   . Arboriculturist in the Last Year:   Transportation Needs:   . Film/video editor (Medical):   Marland Kitchen Lack of Transportation (Non-Medical):   Physical Activity:   . Days of Exercise per Week:   . Minutes of Exercise per Session:   Stress:   . Feeling of Stress :   Social Connections:   . Frequency of Communication with Friends and Family:   . Frequency of Social Gatherings with Friends and Family:   . Attends Religious Services:   . Active Member of Clubs or Organizations:   . Attends Archivist Meetings:   Marland Kitchen Marital Status:   Intimate Partner Violence:   . Fear of Current or Ex-Partner:   . Emotionally Abused:   Marland Kitchen Physically Abused:   . Sexually Abused:  Past Surgical History:  Procedure Laterality Date  . ABDOMINAL SURGERY    . APPENDECTOMY    . Bladder Tack    . BREAST BIOPSY Left 2015  . CESAREAN SECTION  1994  . TONSILLECTOMY AND ADENOIDECTOMY      Family History  Problem Relation Age of Onset  . CAD Mother   . Arthritis Mother   . Heart disease Mother   . Hypertension Mother   . Hypertension Father   . Heart disease Father   . Cystic fibrosis Son     PMHx, SurgHx, SocialHx, FamHx, Medications, and Allergies were reviewed in the Visit Navigator and updated as appropriate.   Patient Active Problem List   Diagnosis Date Noted  . IBS (irritable bowel syndrome) 09/30/2019  . Chronic pain of right knee 07/30/2019  . Bipolar 2 disorder (HCC), Rx Prozac and Lithium 01/24/2019  . Eating disorder in remission, Hx of anorexia as a  teen, struggles with emotional and binge eating as an adult, followed by Psych, RD; Intuitive Eating 01/24/2019  . Primary osteoarthritis of both knees, Rx Celebrex 11/28/2018  . Insulin resistance, diet-controlled 10/24/2016  . Cardiac murmur, 2016 ECHO: LVEF 60-65%, aortic valve sclerosis, trivial TR, ASCVD 2.8% 08/03/2015  . Essential hypertension, now controlled on no medication 05/31/2014  . Hyperlipidemia LDL goal <130, Rx Zocor and Fenofibrate 05/31/2014  . Recurrent major depressive disorder, in partial remission (HCC) 05/31/2014    Social History   Tobacco Use  . Smoking status: Never Smoker  . Smokeless tobacco: Never Used  Substance Use Topics  . Alcohol use: Yes    Alcohol/week: 7.0 standard drinks    Types: 7 Standard drinks or equivalent per week  . Drug use: No    Current Medications and Allergies:    Current Outpatient Medications:  .  atorvastatin (LIPITOR) 10 MG tablet, TAKE 1 TABLET BY MOUTH DAILY, Disp: 90 tablet, Rfl: 0 .  celecoxib (CELEBREX) 200 MG capsule, Take 1 capsule (200 mg total) by mouth 2 (two) times daily., Disp: 180 capsule, Rfl: 1 .  Cholecalciferol (VITAMIN D-1000 MAX ST) 25 MCG (1000 UT) tablet, Take 1,000 Units by mouth daily. , Disp: , Rfl:  .  fenofibrate 160 MG tablet, TAKE ONE (1) TABLET BY MOUTH EVERY DAY, Disp: 30 tablet, Rfl: 1 .  FLUoxetine (PROZAC) 40 MG capsule, Take 1 capsule by mouth daily., Disp: , Rfl:  .  lithium carbonate 300 MG capsule, Take 300 mg by mouth 2 (two) times daily with a meal. , Disp: , Rfl:  .  valACYclovir (VALTREX) 1000 MG tablet, Take 2 tablets (2,000 mg total) by mouth 2 (two) times daily., Disp: 8 tablet, Rfl: 0   Allergies  Allergen Reactions  . Codeine     Headache    Review of Systems   ROS  Negative unless otherwise specified per HPI.  Vitals:   Vitals:   02/11/20 0803  BP: 130/86  Pulse: 69  SpO2: 95%  Weight: 184 lb 6.1 oz (83.6 kg)  Height: 5\' 5"  (1.651 m)     Body mass index is  30.68 kg/m.   Physical Exam:    Physical Exam Vitals and nursing note reviewed.  Constitutional:      General: She is not in acute distress.    Appearance: She is well-developed. She is not ill-appearing or toxic-appearing.  Cardiovascular:     Rate and Rhythm: Normal rate and regular rhythm.     Pulses: Normal pulses.     Heart sounds: Normal  heart sounds, S1 normal and S2 normal.     Comments: No LE edema Pulmonary:     Effort: Pulmonary effort is normal.     Breath sounds: Normal breath sounds.  Abdominal:     General: Abdomen is flat. Bowel sounds are normal.     Palpations: Abdomen is soft.     Tenderness: There is abdominal tenderness in the epigastric area. There is no right CVA tenderness, left CVA tenderness, guarding or rebound. Negative signs include Murphy's sign.  Skin:    General: Skin is warm and dry.  Neurological:     Mental Status: She is alert.     GCS: GCS eye subscore is 4. GCS verbal subscore is 5. GCS motor subscore is 6.  Psychiatric:        Speech: Speech normal.        Behavior: Behavior normal. Behavior is cooperative.      Assessment and Plan:    Holly Richardson was seen today for hyperlipidemia and back pain.  Diagnoses and all orders for this visit:  Hyperlipidemia LDL goal <130 Update lipid panel today and determine need for changes in medications. -     Lipid panel  Epigastric pain No evidence of acute abdomen today. DDx includes: gastritis, gastric ulcer, pancreatitis, constipation, gallstones, among others. Will update labs. I have asked her avoid alcohol and start PPI to see if this will help her symptoms. Low threshold to obtain imaging, which she verbalized understanding of and would like to defer to day. Will update labs to check for other possible etiologies of symptoms. -     CBC with Differential/Platelet -     Comprehensive metabolic panel -     H. pylori antibody, IgG -     Lipase  High risk medication use -     Lithium  level  Screening for HIV (human immunodeficiency virus) -     HIV Antibody (routine testing w rflx)  Encounter for screening for other viral diseases -     Hepatitis C antibody    . Reviewed expectations re: course of current medical issues. . Discussed self-management of symptoms. . Outlined signs and symptoms indicating need for more acute intervention. . Patient verbalized understanding and all questions were answered. . See orders for this visit as documented in the electronic medical record. . Patient received an After Visit Summary.  CMA or LPN served as scribe during this visit. History, Physical, and Plan performed by medical provider. The above documentation has been reviewed and is accurate and complete.  [name], PA-C [place] [place] 02/11/2020  Follow-up: No follow-ups on file.

## 2020-02-11 NOTE — Patient Instructions (Signed)
It was great to see you!  Trial over the counter daily nexium or prilosec (generic equivalent okay) for a week or two. Please avoid alcohol to see if that helps.  I will be in touch with labs.  If worsening pain, please let me know so we can get imaging (CT scan or ultrasound.)  If severe pain, go to the ER.  Take care,  [name] PA-C

## 2020-02-12 ENCOUNTER — Other Ambulatory Visit: Payer: Self-pay | Admitting: Physician Assistant

## 2020-02-12 LAB — HEPATITIS C ANTIBODY
Hepatitis C Ab: NONREACTIVE
SIGNAL TO CUT-OFF: 0.02 (ref <1.00)

## 2020-02-12 LAB — HIV ANTIBODY (ROUTINE TESTING W REFLEX): HIV 1&2 Ab, 4th Generation: NONREACTIVE

## 2020-02-12 LAB — LITHIUM LEVEL: Lithium Lvl: 1 mmol/L (ref 0.6–1.2)

## 2020-02-12 MED ORDER — PANTOPRAZOLE SODIUM 40 MG PO TBEC: 40.0000 mg | DELAYED_RELEASE_TABLET | Freq: Two times a day (BID) | ORAL | 0 refills | Status: DC

## 2020-02-12 MED ORDER — CLARITHROMYCIN 500 MG PO TABS: 500.0000 mg | ORAL_TABLET | Freq: Two times a day (BID) | ORAL | 0 refills | Status: DC

## 2020-02-12 MED ORDER — AMOXICILLIN 500 MG PO CAPS: 1000.0000 mg | ORAL_CAPSULE | Freq: Two times a day (BID) | ORAL | 0 refills | Status: AC

## 2020-02-13 ENCOUNTER — Encounter: Payer: Self-pay | Admitting: Physician Assistant

## 2020-02-13 NOTE — Telephone Encounter (Signed)
Please verify.. Is patient to take Clarithromycin for 7 days or 14 days? Her note says 14 days days, but patient was only sent in a 7 days supply. Thanks.

## 2020-02-14 ENCOUNTER — Other Ambulatory Visit: Payer: Self-pay | Admitting: Physician Assistant

## 2020-02-14 MED ORDER — CLARITHROMYCIN 500 MG PO TABS: 500.0000 mg | ORAL_TABLET | Freq: Two times a day (BID) | ORAL | 0 refills | Status: AC

## 2020-02-25 ENCOUNTER — Other Ambulatory Visit: Payer: Self-pay

## 2020-02-25 ENCOUNTER — Encounter: Payer: Self-pay | Admitting: Physician Assistant

## 2020-02-25 ENCOUNTER — Ambulatory Visit: Payer: BC Managed Care – PPO | Admitting: Physician Assistant

## 2020-02-25 ENCOUNTER — Ambulatory Visit (INDEPENDENT_AMBULATORY_CARE_PROVIDER_SITE_OTHER): Payer: BC Managed Care – PPO

## 2020-02-25 VITALS — BP 130/80 | HR 77 | Temp 97.0°F | Ht 65.0 in | Wt 184.5 lb

## 2020-02-25 DIAGNOSIS — R0781 Pleurodynia: Secondary | ICD-10-CM | POA: Diagnosis not present

## 2020-02-25 MED ORDER — METHOCARBAMOL 500 MG PO TABS: 500.0000 mg | ORAL_TABLET | Freq: Three times a day (TID) | ORAL | 0 refills | Status: DC | PRN

## 2020-02-25 NOTE — Progress Notes (Signed)
Holly Richardson is a 59 y.o. female here for a new problem.  I acted as a Education administrator for [employer], PA-C Anselmo Pickler, LPN  History of Present Illness:   Chief Complaint  Patient presents with  . Fall  . Breast Pain    HPI   Breast pain Pt c/o right breast pain, she fell on her breast Friday outside going up step and she fell against the edge of the step. Right breast is bruised. Pain with movement and deep inspiration. Pt taking Percocet that she had at home with relief.  Denies: SOB, fever, severe chest pain, cough   Past Medical History:  Diagnosis Date  . Anorexia 1982  . Anxiety   . Arthritis   . Depression   . Heart murmur   . History of chicken pox   . Hyperlipidemia   . Hypertension   . Kidney stones   . Vaginal delivery 1992     Social History   Socioeconomic History  . Marital status: Married    Spouse name: Not on file  . Number of children: Not on file  . Years of education: Not on file  . Highest education level: Not on file  Occupational History    Employer: [employer]  Tobacco Use  . Smoking status: Never Smoker  . Smokeless tobacco: Never Used  Substance and Sexual Activity  . Alcohol use: Yes    Alcohol/week: 7.0 standard drinks    Types: 7 Standard drinks or equivalent per week  . Drug use: No  . Sexual activity: Not Currently  Other Topics Concern  . Not on file  Social History Narrative   [name]   Married   2 sons -- Museum/gallery exhibitions officer (cystic fibrosis)    Social Determinants of Health   Financial Resource Strain:   . Difficulty of Paying Living Expenses:   Food Insecurity:   . Worried About Charity fundraiser in the Last Year:   . Arboriculturist in the Last Year:   Transportation Needs:   . Film/video editor (Medical):   Marland Kitchen Lack of Transportation (Non-Medical):   Physical Activity:   . Days of Exercise per Week:   . Minutes of Exercise per Session:   Stress:   .  Feeling of Stress :   Social Connections:   . Frequency of Communication with Friends and Family:   . Frequency of Social Gatherings with Friends and Family:   . Attends Religious Services:   . Active Member of Clubs or Organizations:   . Attends Archivist Meetings:   Marland Kitchen Marital Status:   Intimate Partner Violence:   . Fear of Current or Ex-Partner:   . Emotionally Abused:   Marland Kitchen Physically Abused:   . Sexually Abused:     Past Surgical History:  Procedure Laterality Date  . ABDOMINAL SURGERY    . APPENDECTOMY    . Bladder Tack    . BREAST BIOPSY Left 2015  . CESAREAN SECTION  1994  . TONSILLECTOMY AND ADENOIDECTOMY      Family History  Problem Relation Age of Onset  . CAD Mother   . Arthritis Mother   . Heart disease Mother   . Hypertension Mother   . Hypertension Father   . Heart disease Father   . Cystic fibrosis Son     Allergies  Allergen Reactions  . Codeine     Headache    Current Medications:   Current  Outpatient Medications:  .  amoxicillin (AMOXIL) 500 MG capsule, Take 2 capsules (1,000 mg total) by mouth 2 (two) times daily for 14 days., Disp: 56 capsule, Rfl: 0 .  celecoxib (CELEBREX) 200 MG capsule, Take 1 capsule (200 mg total) by mouth 2 (two) times daily., Disp: 180 capsule, Rfl: 1 .  Cholecalciferol (VITAMIN D-1000 MAX ST) 25 MCG (1000 UT) tablet, Take 1,000 Units by mouth daily. , Disp: , Rfl:  .  clarithromycin (BIAXIN) 500 MG tablet, Take 1 tablet (500 mg total) by mouth 2 (two) times daily for 14 days., Disp: 28 tablet, Rfl: 0 .  fenofibrate 160 MG tablet, TAKE ONE (1) TABLET BY MOUTH EVERY DAY, Disp: 30 tablet, Rfl: 1 .  FLUoxetine (PROZAC) 40 MG capsule, Take 1 capsule by mouth daily., Disp: , Rfl:  .  lithium carbonate 300 MG capsule, Take 300 mg by mouth 2 (two) times daily with a meal. , Disp: , Rfl:  .  pantoprazole (PROTONIX) 40 MG tablet, Take 1 tablet (40 mg total) by mouth 2 (two) times daily for 14 days., Disp: 28 tablet, Rfl:  0 .  valACYclovir (VALTREX) 1000 MG tablet, Take 2 tablets (2,000 mg total) by mouth 2 (two) times daily. (Patient taking differently: Take 2,000 mg by mouth as needed. ), Disp: 8 tablet, Rfl: 0 .  atorvastatin (LIPITOR) 10 MG tablet, TAKE 1 TABLET BY MOUTH DAILY (Patient not taking: Reported on 02/25/2020), Disp: 90 tablet, Rfl: 0 .  methocarbamol (ROBAXIN) 500 MG tablet, Take 1 tablet (500 mg total) by mouth every 8 (eight) hours as needed for muscle spasms., Disp: 30 tablet, Rfl: 0   Review of Systems:   ROS  Negative unless otherwise specified per HPI.  Vitals:   Vitals:   02/25/20 1313  BP: 130/80  Pulse: 77  Temp: (!) 97 F (36.1 C)  TempSrc: Temporal  SpO2: 97%  Weight: 184 lb 8 oz (83.7 kg)  Height: 5\' 5"  (1.651 m)     Body mass index is 30.7 kg/m.  Physical Exam:   Physical Exam Vitals and nursing note reviewed.  Constitutional:      General: She is not in acute distress.    Appearance: She is well-developed. She is not ill-appearing or toxic-appearing.  Cardiovascular:     Rate and Rhythm: Normal rate and regular rhythm.     Pulses: Normal pulses.     Heart sounds: Normal heart sounds, S1 normal and S2 normal.     Comments: No LE edema Pulmonary:     Effort: Pulmonary effort is normal.     Breath sounds: Normal breath sounds.  Chest:       Comments: Ecchymosis and significant tenderness to palpation; no bony protrusions Skin:    General: Skin is warm and dry.  Neurological:     Mental Status: She is alert.     GCS: GCS eye subscore is 4. GCS verbal subscore is 5. GCS motor subscore is 6.  Psychiatric:        Speech: Speech normal.        Behavior: Behavior normal. Behavior is cooperative.       Assessment and Plan:   [name] was seen today for fall and breast pain.  Diagnoses and all orders for this visit:  Rib pain on right side -     DG Ribs Unilateral W/Chest Right; Future  Other orders -     methocarbamol (ROBAXIN) 500 MG tablet; Take 1  tablet (500 mg total) by mouth every  8 (eight) hours as needed for muscle spasms.   X-ray pending.  No red flags on exam.  We will continue Percocet.  I have also given her a prescription for Robaxin to use as needed.  Recommended ice and occasional deep breaths.  Worsening precautions advised, encouraged her to pay attention to breathing and if she develops any sudden onset chest pain or difficulty breathing to immediately proceed to the ER.  I have also given her a work note to have the rest of the week off.  . Reviewed expectations re: course of current medical issues. . Discussed self-management of symptoms. . Outlined signs and symptoms indicating need for more acute intervention. . Patient verbalized understanding and all questions were answered. . See orders for this visit as documented in the electronic medical record. . Patient received an After-Visit Summary.  CMA or LPN served as scribe during this visit. History, Physical, and Plan performed by medical provider. The above documentation has been reviewed and is accurate and complete.  [name], PA-C

## 2020-02-25 NOTE — Patient Instructions (Signed)
It was great to see you!  Continue ice and percocet.  Miralax to soften stools.  Robaxin muscle relaxer for muscle spasms.  Try to take deep breaths.   Rib Contusion A rib contusion is a deep bruise on your rib area. Contusions are the result of a blunt trauma that causes bleeding and injury to the tissues under the skin. A rib contusion may involve bruising of the ribs and of the skin and muscles in the area. The skin over the contusion may turn blue, purple, or yellow. Minor injuries will give you a painless contusion. More severe contusions may stay painful and swollen for a few weeks. What are the causes? This condition is usually caused by a blow, trauma, or direct force to an area of the body. This often occurs while playing contact sports. What are the signs or symptoms? Symptoms of this condition include:  Swelling and redness of the injured area.  Discoloration of the injured area.  Tenderness and soreness of the injured area.  Pain with or without movement. How is this diagnosed? This condition may be diagnosed based on:  Your symptoms and medical history.  A physical exam.  Imaging tests--such as an X-ray, CT scan, or MRI--to determine if there were internal injuries or broken bones (fractures). How is this treated? This condition may be treated with:  Rest. This is often the best treatment for a rib contusion.  Icing. This reduces swelling and inflammation.  Deep-breathing exercises. These may be recommended to reduce the risk for lung collapse and pneumonia.  Medicines. Over-the-counter or prescription medicines may be given to control pain.  Injection of a numbing medicine around the nerve near your injury (nerve block). Follow these instructions at home:     Medicines  Take over-the-counter and prescription medicines only as told by your health care provider.  Do not drive or use heavy machinery while taking prescription pain medicine.  If you are  taking prescription pain medicine, take actions to prevent or treat constipation. Your health care provider may recommend that you: ? Drink enough fluid to keep your urine pale yellow. ? Eat foods that are high in fiber, such as fresh fruits and vegetables, whole grains, and beans. ? Limit foods that are high in fat and processed sugars, such as fried or sweet foods. ? Take an over-the-counter or prescription medicine for constipation. Managing pain, stiffness, and swelling  If directed, put ice on the injured area: ? Put ice in a plastic bag. ? Place a towel between your skin and the bag. ? Leave the ice on for 20 minutes, 2-3 times a day.  Rest the injured area. Avoid strenuous activity and any activities or movements that cause pain. Be careful during activities and avoid bumping the injured area.  Do not lift anything that is heavier than 5 lb (2.3 kg), or the limit that you are told, until your health care provider says that it is safe. General instructions  Do not use any products that contain nicotine or tobacco, such as cigarettes and e-cigarettes. These can delay healing. If you need help quitting, ask your health care provider.  Do deep-breathing exercises as told by your health care provider.  If you were given an incentive spirometer, use it every 1-2 hours while you are awake, or as recommended by your health care provider. This device measures how well you are filling your lungs with each breath.  Keep all follow-up visits as told by your health care provider. This  is important. Contact a health care provider if you have:  Increased bruising or swelling.  Pain that is not controlled with treatment.  A fever. Get help right away if you:  Have difficulty breathing or shortness of breath.  Develop a continual cough or you cough up thick or bloody sputum.  Feel nauseous or you vomit.  Have pain in your abdomen. Summary  A rib contusion is a deep bruise on your rib  area. Contusions are the result of a blunt trauma that causes bleeding and injury to the tissues under the skin.  The skin overlying the contusion may turn blue, purple, or yellow. Minor injuries may give you a painless contusion. More severe contusions may stay painful and swollen for a few weeks.  Rest the injured area. Avoid strenuous activity and any activities or movements that cause pain. This information is not intended to replace advice given to you by your health care provider. Make sure you discuss any questions you have with your health care provider. Document Revised: 11/22/2017 Document Reviewed: 11/22/2017 Elsevier Patient Education  2020 [employer].   Take care,  [name] PA-C

## 2020-03-26 ENCOUNTER — Encounter: Payer: Self-pay | Admitting: Physician Assistant

## 2020-03-27 ENCOUNTER — Other Ambulatory Visit: Payer: Self-pay | Admitting: Physician Assistant

## 2020-03-27 DIAGNOSIS — A048 Other specified bacterial intestinal infections: Secondary | ICD-10-CM

## 2020-03-30 ENCOUNTER — Other Ambulatory Visit: Payer: Self-pay | Admitting: Physician Assistant

## 2020-03-31 ENCOUNTER — Other Ambulatory Visit: Payer: Self-pay

## 2020-03-31 ENCOUNTER — Other Ambulatory Visit: Payer: BC Managed Care – PPO

## 2020-03-31 DIAGNOSIS — A048 Other specified bacterial intestinal infections: Secondary | ICD-10-CM

## 2020-04-01 LAB — H. PYLORI BREATH TEST: H. pylori Breath Test: NOT DETECTED

## 2020-04-02 ENCOUNTER — Encounter: Payer: Self-pay | Admitting: Physician Assistant

## 2020-07-07 ENCOUNTER — Encounter: Payer: Self-pay | Admitting: Physician Assistant

## 2020-07-09 ENCOUNTER — Other Ambulatory Visit: Payer: Self-pay | Admitting: Physician Assistant

## 2020-07-21 ENCOUNTER — Other Ambulatory Visit: Payer: Self-pay

## 2020-07-21 ENCOUNTER — Ambulatory Visit: Payer: BC Managed Care – PPO | Admitting: Physician Assistant

## 2020-07-21 ENCOUNTER — Encounter: Payer: Self-pay | Admitting: Physician Assistant

## 2020-07-21 VITALS — BP 120/80 | HR 64 | Temp 97.8°F | Ht 65.0 in | Wt 183.2 lb

## 2020-07-21 DIAGNOSIS — E785 Hyperlipidemia, unspecified: Secondary | ICD-10-CM | POA: Diagnosis not present

## 2020-07-21 DIAGNOSIS — I10 Essential (primary) hypertension: Secondary | ICD-10-CM

## 2020-07-21 DIAGNOSIS — Z79899 Other long term (current) drug therapy: Secondary | ICD-10-CM

## 2020-07-21 DIAGNOSIS — F3181 Bipolar II disorder: Secondary | ICD-10-CM

## 2020-07-21 NOTE — Progress Notes (Signed)
Holly Richardson is a 59 y.o. female is here to discuss:  History of Present Illness:   Chief Complaint  Patient presents with  . Hypertension  . Hyperlipidemia    HPI   Hypertension Pt c/o blood pressure being elevated past few weeks due to anxiety. Averaging 144/88. Pt denies dizziness, blurred vision, chest pain, SOB or lower leg edema. Pt has had some headaches. Denies excessive caffeine intake, stimulant usage, excessive alcohol intake or increase in salt consumption. Was previously on Telmisartan -- 20 mg.   Bipolar 2 Disorder Currently sees Deatra Robinson -- last saw in August, due in November to be re-evaluated. She is currently doing well on Prozac 40 mg daily and lithium 300 mg BID. Denies SI/HI. She has been having increase in panic attacks but is working through this with her psychiatrist.  Hyperlipidemia Pt following up today, currenlty taking Lipitor 10 mg daily and Fenofibrate 160 mg daily. Compliant with medication.   There are no preventive care reminders to display for this patient.  Past Medical History:  Diagnosis Date  . Anorexia 1982  . Anxiety   . Arthritis   . Depression   . Heart murmur   . History of chicken pox   . Hyperlipidemia   . Hypertension   . Kidney stones   . Vaginal delivery 1992     Social History   Tobacco Use  . Smoking status: Never Smoker  . Smokeless tobacco: Never Used  Vaping Use  . Vaping Use: Never used  Substance Use Topics  . Alcohol use: Yes    Alcohol/week: 7.0 standard drinks    Types: 7 Standard drinks or equivalent per week  . Drug use: No    Past Surgical History:  Procedure Laterality Date  . ABDOMINAL SURGERY    . APPENDECTOMY    . Bladder Tack    . BREAST BIOPSY Left 2015  . CESAREAN SECTION  1994  . TONSILLECTOMY AND ADENOIDECTOMY      Family History  Problem Relation Age of Onset  . CAD Mother   . Arthritis Mother   . Heart disease Mother   . Hypertension Mother   . Hypertension  Father   . Heart disease Father   . Cystic fibrosis Son     PMHx, SurgHx, SocialHx, FamHx, Medications, and Allergies were reviewed in the Visit Navigator and updated as appropriate.   Patient Active Problem List   Diagnosis Date Noted  . IBS (irritable bowel syndrome) 09/30/2019  . Chronic pain of right knee 07/30/2019  . Bipolar 2 disorder (HCC), Rx Prozac and Lithium 01/24/2019  . Eating disorder in remission, Hx of anorexia as a teen, struggles with emotional and binge eating as an adult, followed by Psych, RD; Intuitive Eating 01/24/2019  . Primary osteoarthritis of both knees, Rx Celebrex 11/28/2018  . Insulin resistance, diet-controlled 10/24/2016  . Cardiac murmur, 2016 ECHO: LVEF 60-65%, aortic valve sclerosis, trivial TR, ASCVD 2.8% 08/03/2015  . Essential hypertension, now controlled on no medication 05/31/2014  . Hyperlipidemia LDL goal <130, Rx Zocor and Fenofibrate 05/31/2014  . Recurrent major depressive disorder, in partial remission (HCC) 05/31/2014    Social History   Tobacco Use  . Smoking status: Never Smoker  . Smokeless tobacco: Never Used  Vaping Use  . Vaping Use: Never used  Substance Use Topics  . Alcohol use: Yes    Alcohol/week: 7.0 standard drinks    Types: 7 Standard drinks or equivalent per week  . Drug use:  No    Current Medications and Allergies:    Current Outpatient Medications:  .  ALPRAZolam (XANAX) 0.5 MG tablet, Take 0.25-0.5 mg by mouth daily as needed., Disp: , Rfl:  .  atorvastatin (LIPITOR) 10 MG tablet, TAKE 1 TABLET BY MOUTH DAILY, Disp: 90 tablet, Rfl: 0 .  celecoxib (CELEBREX) 200 MG capsule, Take 1 capsule (200 mg total) by mouth 2 (two) times daily., Disp: 180 capsule, Rfl: 1 .  Cholecalciferol (VITAMIN D-1000 MAX ST) 25 MCG (1000 UT) tablet, Take 1,000 Units by mouth daily. , Disp: , Rfl:  .  fenofibrate 160 MG tablet, TAKE ONE (1) TABLET BY MOUTH EVERY DAY, Disp: 30 tablet, Rfl: 1 .  FLUoxetine (PROZAC) 40 MG capsule, Take  1 capsule by mouth daily., Disp: , Rfl:  .  lithium carbonate 300 MG capsule, Take 300 mg by mouth 2 (two) times daily with a meal. , Disp: , Rfl:  .  methocarbamol (ROBAXIN) 500 MG tablet, Take 1 tablet (500 mg total) by mouth every 8 (eight) hours as needed for muscle spasms., Disp: 30 tablet, Rfl: 0 .  valACYclovir (VALTREX) 1000 MG tablet, Take 2 tablets (2,000 mg total) by mouth 2 (two) times daily. (Patient taking differently: Take 2,000 mg by mouth as needed. ), Disp: 8 tablet, Rfl: 0   Allergies  Allergen Reactions  . Codeine     Headache    Review of Systems   ROS  Negative unless otherwise specified per HPI.  Vitals:   Vitals:   07/21/20 0835  BP: 120/80  Pulse: 64  Temp: 97.8 F (36.6 C)  TempSrc: Temporal  SpO2: 96%  Weight: 183 lb 4 oz (83.1 kg)  Height: 5\' 5"  (1.651 m)     Body mass index is 30.49 kg/m.   Physical Exam:    Physical Exam Vitals and nursing note reviewed.  Constitutional:      General: She is not in acute distress.    Appearance: She is well-developed. She is not ill-appearing or toxic-appearing.  Cardiovascular:     Rate and Rhythm: Normal rate and regular rhythm.     Pulses: Normal pulses.     Heart sounds: Normal heart sounds, S1 normal and S2 normal.     Comments: No LE edema Pulmonary:     Effort: Pulmonary effort is normal.     Breath sounds: Normal breath sounds.  Skin:    General: Skin is warm and dry.  Neurological:     Mental Status: She is alert.     GCS: GCS eye subscore is 4. GCS verbal subscore is 5. GCS motor subscore is 6.  Psychiatric:        Speech: Speech normal.        Behavior: Behavior normal. Behavior is cooperative.      Assessment and Plan:    Ryland was seen today for hypertension and hyperlipidemia.  Diagnoses and all orders for this visit:  High risk medication use Update lithium level per patient request. -     Lithium level; Future -     Lithium level  Hyperlipidemia LDL goal  <130 Update lipid panel today and reassess lipitor 10 mg. Follow-up in 3-6 months for CPE. -     Lipid panel; Future -     Lipid panel  Essential hypertension, now controlled on no medication Currently normotensive in the office. Low threshold to restart Telmisartan if numbers consistently <150/90.  Bipolar 2 disorder (HCC), Rx Prozac and Lithium Currently overall well controlled, management  per psychiatry/psychology.  CMA or LPN served as scribe during this visit. History, Physical, and Plan performed by medical provider. The above documentation has been reviewed and is accurate and complete.  [name], PA-C [place] 07/21/2020  Follow-up: No follow-ups on file.

## 2020-07-21 NOTE — Patient Instructions (Signed)
It was great to see you!  My blood pressure goal for you is 150/90 -- come back to see me if you are getting numbers consistently greater than this  I will be in touch via MyChart with your lab results.  Let's follow-up in 3-6 months for a physical, sooner if you have concerns.  Take care,  [name] PA-C

## 2020-07-22 ENCOUNTER — Encounter: Payer: Self-pay | Admitting: Physician Assistant

## 2020-07-22 LAB — LIPID PANEL
Cholesterol: 181 mg/dL (ref <200)
HDL: 47 mg/dL — ABNORMAL LOW (ref >=50)
LDL Cholesterol (Calc): 107 mg/dL (calc) — ABNORMAL HIGH
Non-HDL Cholesterol (Calc): 134 mg/dL (calc) — ABNORMAL HIGH (ref <130)
Total CHOL/HDL Ratio: 3.9 (calc) (ref <5.0)
Triglycerides: 158 mg/dL — ABNORMAL HIGH (ref <150)

## 2020-07-22 LAB — LITHIUM LEVEL: Lithium Lvl: 0.5 mmol/L — ABNORMAL LOW (ref 0.6–1.2)

## 2020-08-25 ENCOUNTER — Other Ambulatory Visit: Payer: Self-pay

## 2020-08-25 ENCOUNTER — Ambulatory Visit: Payer: BC Managed Care – PPO | Admitting: Physician Assistant

## 2020-08-25 ENCOUNTER — Telehealth: Payer: Self-pay | Admitting: *Deleted

## 2020-08-25 ENCOUNTER — Other Ambulatory Visit: Payer: Self-pay | Admitting: Physician Assistant

## 2020-08-25 ENCOUNTER — Ambulatory Visit (HOSPITAL_COMMUNITY)
Admission: RE | Admit: 2020-08-25 | Discharge: 2020-08-25 | Disposition: A | Discharge status: Home / Self Care | Payer: BC Managed Care – PPO | Source: Ambulatory Visit | Attending: Physician Assistant | Admitting: Physician Assistant

## 2020-08-25 ENCOUNTER — Encounter: Payer: Self-pay | Admitting: Physician Assistant

## 2020-08-25 VITALS — BP 130/86 | HR 68 | Temp 97.7°F | Ht 65.0 in | Wt 183.2 lb

## 2020-08-25 DIAGNOSIS — M25571 Pain in right ankle and joints of right foot: Secondary | ICD-10-CM | POA: Insufficient documentation

## 2020-08-25 DIAGNOSIS — M25572 Pain in left ankle and joints of left foot: Secondary | ICD-10-CM | POA: Diagnosis not present

## 2020-08-25 MED ORDER — KETOROLAC TROMETHAMINE 60 MG/2ML IM SOLN
60.0000 mg | Freq: Once | INTRAMUSCULAR | Status: AC
  Administered 2020-08-25: 60 mg via INTRAMUSCULAR

## 2020-08-25 NOTE — Telephone Encounter (Signed)
Samantha, please see message. 

## 2020-08-25 NOTE — Telephone Encounter (Signed)
Darl Pikes from [facility] calling to let us know pt is Negative for DVT Left leg. Earmon Phoenix she can let pt go. Darl Pikes verbalized understanding.

## 2020-08-25 NOTE — Progress Notes (Signed)
Holly Richardson is a 59 y.o. female here for a new problem.  I acted as a Neurosurgeon for [employer], PA-C Corky Mull, LPN   History of Present Illness:   Chief Complaint  Patient presents with  . Foot Pain    HPI   Heel pain Pt c/o left heel pain for a couple of weeks. Denies inciting event. Since Sunday she has had constant pain. Pt has been using hot/cold compresses and elevating the past two days. Pt says pain is in her heel and radiates into calf off and on. Pt is using Celebrex no relief. She took one Percocet yesterday.  Denies: SOB, palpitations, inability to bear weight  Past Medical History:  Diagnosis Date  . Anorexia 1982  . Anxiety   . Arthritis   . Depression   . Heart murmur   . History of chicken pox   . Hyperlipidemia   . Hypertension   . Kidney stones   . Vaginal delivery 1992     Social History   Tobacco Use  . Smoking status: Never Smoker  . Smokeless tobacco: Never Used  Vaping Use  . Vaping Use: Never used  Substance Use Topics  . Alcohol use: Yes    Alcohol/week: 7.0 standard drinks    Types: 7 Standard drinks or equivalent per week  . Drug use: No    Past Surgical History:  Procedure Laterality Date  . ABDOMINAL SURGERY    . APPENDECTOMY    . Bladder Tack    . BREAST BIOPSY Left 2015  . CESAREAN SECTION  1994  . TONSILLECTOMY AND ADENOIDECTOMY      Family History  Problem Relation Age of Onset  . CAD Mother   . Arthritis Mother   . Heart disease Mother   . Hypertension Mother   . Hypertension Father   . Heart disease Father   . Cystic fibrosis Son     Allergies  Allergen Reactions  . Codeine     Headache    Current Medications:   Current Outpatient Medications:  .  ALPRAZolam (XANAX) 0.5 MG tablet, Take 0.25-0.5 mg by mouth daily as needed., Disp: , Rfl:  .  atorvastatin (LIPITOR) 10 MG tablet, TAKE 1 TABLET BY MOUTH DAILY, Disp: 90 tablet, Rfl: 0 .  celecoxib (CELEBREX) 200 MG capsule, Take 1 capsule  (200 mg total) by mouth 2 (two) times daily., Disp: 180 capsule, Rfl: 1 .  Cholecalciferol (VITAMIN D-1000 MAX ST) 25 MCG (1000 UT) tablet, Take 1,000 Units by mouth daily. , Disp: , Rfl:  .  fenofibrate 160 MG tablet, TAKE ONE (1) TABLET BY MOUTH EVERY DAY, Disp: 30 tablet, Rfl: 1 .  FLUoxetine (PROZAC) 40 MG capsule, Take 1 capsule by mouth daily., Disp: , Rfl:  .  lithium carbonate 300 MG capsule, Take 300 mg by mouth 2 (two) times daily with a meal. , Disp: , Rfl:  .  methocarbamol (ROBAXIN) 500 MG tablet, Take 1 tablet (500 mg total) by mouth every 8 (eight) hours as needed for muscle spasms., Disp: 30 tablet, Rfl: 0 .  valACYclovir (VALTREX) 1000 MG tablet, Take 2 tablets (2,000 mg total) by mouth 2 (two) times daily. (Patient taking differently: Take 2,000 mg by mouth as needed. ), Disp: 8 tablet, Rfl: 0   Review of Systems:   ROS  Negative unless otherwise specified per HPI.  Vitals:   Vitals:   08/25/20 1008  BP: 130/86  Pulse: 68  Temp: 97.7 F (36.5 C)  TempSrc: Temporal  SpO2: 95%  Weight: 183 lb 4 oz (83.1 kg)  Height: 5\' 5"  (1.651 m)     Body mass index is 30.49 kg/m.  Physical Exam:   Physical Exam Vitals and nursing note reviewed.  Constitutional:      General: She is not in acute distress.    Appearance: She is well-developed. She is not ill-appearing or toxic-appearing.  Cardiovascular:     Rate and Rhythm: Normal rate and regular rhythm.     Pulses: Normal pulses.          Dorsalis pedis pulses are 2+ on the right side and 2+ on the left side.       Posterior tibial pulses are 2+ on the right side and 2+ on the left side.     Heart sounds: Normal heart sounds, S1 normal and S2 normal.     Comments: No LE edema Pulmonary:     Effort: Pulmonary effort is normal.     Breath sounds: Normal breath sounds.  Musculoskeletal:     Comments: TTP to base of L heel Pain to calf with L foot extension No obvious calf swelling or erythema No point tenderness to  calf    Skin:    General: Skin is warm and dry.  Neurological:     Mental Status: She is alert.     GCS: GCS eye subscore is 4. GCS verbal subscore is 5. GCS motor subscore is 6.  Psychiatric:        Speech: Speech normal.        Behavior: Behavior normal. Behavior is cooperative.       Assessment and Plan:   Leshae was seen today for foot pain.  Diagnoses and all orders for this visit:  Acute left ankle pain -     VAS Angelique Blonder LOWER EXTREMITY VENOUS (DVT); Future -     VAS [place] LOWER EXTREMITY VENOUS (DVT); Future -     ketorolac (TORADOL) injection 60 mg   Stat US for DVT r/o. Toradol injection for pain. If negative [place] -- likely referral to sports medicine. Possible achilles tendonitis? Worsening precautions advised, she has been immobilizing here and there when pain worsens.  CMA or LPN served as scribe during this visit. History, Physical, and Plan performed by medical provider. The above documentation has been reviewed and is accurate and complete.   [place], PA-C

## 2020-08-25 NOTE — Patient Instructions (Signed)
It was great to see you!  Toradol injection today for pain/inflammation.  Ultrasound to look for blood clot.  I will be in touch with the results and next steps.  Take care,  [name] PA-C

## 2020-08-26 ENCOUNTER — Ambulatory Visit: Payer: BC Managed Care – PPO | Admitting: Family Medicine

## 2020-08-26 ENCOUNTER — Encounter: Payer: Self-pay | Admitting: Family Medicine

## 2020-08-26 ENCOUNTER — Ambulatory Visit: Payer: Self-pay

## 2020-08-26 ENCOUNTER — Ambulatory Visit (INDEPENDENT_AMBULATORY_CARE_PROVIDER_SITE_OTHER): Payer: BC Managed Care – PPO

## 2020-08-26 VITALS — BP 120/84 | HR 72 | Ht 65.0 in | Wt 189.2 lb

## 2020-08-26 DIAGNOSIS — S86002A Unspecified injury of left Achilles tendon, initial encounter: Secondary | ICD-10-CM | POA: Diagnosis not present

## 2020-08-26 DIAGNOSIS — M25572 Pain in left ankle and joints of left foot: Secondary | ICD-10-CM

## 2020-08-26 NOTE — Progress Notes (Signed)
Subjective:    I'm seeing this patient as a consultation for:  Rinaldo Cloud, [place]. Note will be routed back to referring provider/PCP.  CC: L heel/Achille's pain  I, Christoper Fabian, LAT, ATC, am serving as scribe for Dr. [name].  HPI: Pt is a 59 y/o female presenting w/ c/o L heel pain x few weeks w/ no known MOI.  She locates her pain to her L post calcaneus and L Achilles .  She had a Toradol injection yesterday at her PCP office.  No fevers or chills.  Radiating pain: yes into her L calf Aggravating factors: walking; weight-bearing Treatments tried: ice; heat; elevation; Celebrex; Percocet; walking boot  Diagnostic testing: L LE venous doppler US- 08/25/20  Past medical history, Surgical history, Family history, Social history, Allergies, and medications have been entered into the medical record, reviewed.   Review of Systems: No new headache, visual changes, nausea, vomiting, diarrhea, constipation, dizziness, abdominal pain, skin rash, fevers, chills, night sweats, weight loss, swollen lymph nodes, body aches, joint swelling, muscle aches, chest pain, shortness of breath, mood changes, visual or auditory hallucinations.   Objective:    Vitals:   08/26/20 1531  BP: 120/84  Pulse: 72  SpO2: 96%   General: Well Developed, well nourished, and in no acute distress.  Neuro/Psych: Alert and oriented x3, extra-ocular muscles intact, able to move all 4 extremities, sensation grossly intact. Skin: Warm and dry, no rashes noted.  Respiratory: Not using accessory muscles, speaking in full sentences, trachea midline.  Cardiovascular: Pulses palpable, no extremity edema. Abdomen: Does not appear distended. MSK: Left foot and ankle slightly swollen medial and posterior. Tender palpation medial malleolus.  Tender palpation posterior calcaneus. Decreased motion. Intact strength. Pulses cap refill and sensation are intact distally.  Lab and Radiology Results  X-ray images left ankle  obtained today personally and independently interpreted. Old avulsion fragment at medial malleolus present.  Edges are rounded indicating old injury.  No acute fractures or severe degenerative changes. Posterior calcaneus and plantar calcaneus osteophyte visible. Await formal radiology review  Diagnostic Limited MSK Ultrasound of: Left ankle Achilles tendon intact.  Normal-appearing until insertion at calcaneus where areas of hyperechoic changes present consistent with calcific tendinopathy. Increased vascular activity is in this area consistent with Achilles tendinitis. Medial malleolus visualized.  Bone cortex with visible in area step-off with hyperechoic change superficial to bone cortex.  This is consistent with the avulsion fragments seen on x-ray. Additionally posterior tibialis tendon structures normal-appearing  Impression: Insertional Achilles tendinitis with calcific change in possible exacerbation or a reinjury of old avulsion fracture medial malleolus   Impression and Recommendations:    Assessment and Plan: 59 y.o. female with foot and ankle pain.  Patient has changes that could explain pain on exam and ultrasound today including calcific tendinopathy of the Achilles tendon as well as old avulsion fracture of the medial malleolus.  However she does not have a good story for exacerbation or reinjury.  She is doing pretty well with a cam walker boot.  We will continue cam walker boot and add Voltaren gel and continue Celebrex.  Recheck in about 2 weeks.  Reviewed home exercise program with ATC in clinic today.Marland Kitchen  PDMP not reviewed this encounter. Orders Placed This Encounter  Procedures  . [place] LIMITED JOINT SPACE STRUCTURES LOW LEFT(NO LINKED CHARGES)    Order Specific Question:   Reason for Exam (SYMPTOM  OR DIAGNOSIS REQUIRED)    Answer:   L Achille's pain  Order Specific Question:   Preferred imaging location?    Answer:   [facility]  . DG Ankle  Complete Left    Standing Status:   Future    Number of Occurrences:   1    Standing Expiration Date:   08/26/2021    Order Specific Question:   Reason for Exam (SYMPTOM  OR DIAGNOSIS REQUIRED)    Answer:   eval ankle pain    Order Specific Question:   Is patient pregnant?    Answer:   No    Order Specific Question:   Preferred imaging location?    Answer:   [facility]   No orders of the defined types were placed in this encounter.   Discussed warning signs or symptoms. Please see discharge instructions. Patient expresses understanding.   The above documentation has been reviewed and is accurate and complete [name], M.D.

## 2020-08-26 NOTE — Patient Instructions (Signed)
Thank you for coming in today.  Plan for the cam walker boot as needed.   Please use voltaren gel up to 4x daily for pain as needed.   Do the exercises as needed.   Use oral celebrex as needed. Ok to take with tylenol.    Recheck in 2 weeks or so.

## 2020-08-28 NOTE — Progress Notes (Signed)
Ankle xray shows old change at the inside ankle bone that we talked about in clinic. It also shows bone spur as well.  No acute fracture seen.

## 2020-09-08 NOTE — Progress Notes (Signed)
   I, Philbert Riser, LAT, ATC acting as a scribe for [name], MD.  Holly Richardson is a 59 y.o. female who presents to [facility] today for f/u of L heel pain c no know MOI. Pt was last seen by Dr. Denyse Amass on 08/26/20. Her pn was on her L post calcaneous and L Achilles tendon. Pt had a Toradol injection c her PCP on 08/25/20. At her last visit, pt was advised to continue cam walker boot and add Voltaren gel and continue Celebrex. Pt was given a home exercise program by ATC. Since last being seen pt reports has noticed major improvement, but still c/o pain and burning around the distal Achilles tendon and posterior calcaneous. 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 Describes pn as burning, even at night. Aggravates: standing, walking, shoe selection. Treatments tried: TB exercises. Sporadically, pt c/o pn shooting out from tips of toes when normal toe-off phase of walking. Taking old prescription of Celebrex x2 day, which is improving her pn.  Diagnostic testing: L ankle XR- 08/26/20; L LE venous doppler US -08/25/20  Pertinent review of systems: No fevers or chills  Relevant historical information: Bipolar   Exam:  BP 122/84 (BP Location: Right Arm, Patient Position: Sitting, Cuff Size: Normal)   Pulse 86   Ht 5\' 5"  (1.651 m)   Wt 191 lb (86.6 kg)   SpO2 98%   BMI 31.78 kg/m  General: Well Developed, well nourished, and in no acute distress.   MSK: Left heel normal appearing.  Minimally tender palpation.  Intact strength.    Assessment and Plan: 59 y.o. female with left Achilles tendon tendinopathy.  Improved since last visit.  Improved enough that she has been able to wean out of cam walker boot and progressed beyond band exercises.  Plan to progress to nitroglycerin patch protocol and eccentric exercises.  Recheck in 6 weeks or sooner if needed.  Body weight eccentric exercise program reviewed in clinic today with  me.   PDMP not reviewed this encounter. Orders Placed This Encounter  Procedures  . 58 LIMITED JOINT SPACE STRUCTURES LOW LEFT(NO LINKED CHARGES)    Standing Status:   Future    Number of Occurrences:   1    Standing Expiration Date:   03/09/2021    Order Specific Question:   Reason for Exam (SYMPTOM  OR DIAGNOSIS REQUIRED)    Answer:   Left achilles pain    Order Specific Question:   Preferred imaging location?    Answer:   [place] [facility] ordered this encounter  Medications  . nitroGLYCERIN (NITRODUR - DOSED IN MG/24 HR) 0.2 mg/hr patch    Sig: Apply 1/4 patch daily to tendon for tendonitis.    Dispense:  30 patch    Refill:  1     Discussed warning signs or symptoms. Please see discharge instructions. Patient expresses understanding.   The above documentation has been reviewed and is accurate and complete [facility], M.D.

## 2020-09-09 ENCOUNTER — Ambulatory Visit: Payer: Self-pay

## 2020-09-09 ENCOUNTER — Ambulatory Visit: Payer: BC Managed Care – PPO | Admitting: Family Medicine

## 2020-09-09 ENCOUNTER — Other Ambulatory Visit: Payer: Self-pay

## 2020-09-09 ENCOUNTER — Encounter: Payer: Self-pay | Admitting: Family Medicine

## 2020-09-09 VITALS — BP 122/84 | HR 86 | Ht 65.0 in | Wt 191.0 lb

## 2020-09-09 DIAGNOSIS — M79672 Pain in left foot: Secondary | ICD-10-CM

## 2020-09-09 DIAGNOSIS — G8929 Other chronic pain: Secondary | ICD-10-CM

## 2020-09-09 MED ORDER — NITROGLYCERIN 0.2 MG/HR TD PT24: MEDICATED_PATCH | TRANSDERMAL | 1 refills | Status: DC

## 2020-09-09 NOTE — Patient Instructions (Signed)
Thank you for coming in today.  Plan for nitro patches.  Do the heel exercises.  Go from up to down slowly over about 3-4 seconds.  30 reps 2-3x daily.   Ok to use the boot if having a bad days.   Nitroglycerin Protocol   Apply 1/4 nitroglycerin patch to affected area daily.  Change position of patch within the affected area every 24 hours.  You may experience a headache during the first 1-2 weeks of using the patch, these should subside.  If you experience headaches after beginning nitroglycerin patch treatment, you may take your preferred over the counter pain reliever.  Another side effect of the nitroglycerin patch is skin irritation or rash related to patch adhesive.  Please notify our office if you develop more severe headaches or rash, and stop the patch.  Tendon healing with nitroglycerin patch may require 12 to 24 weeks depending on the extent of injury.  Men should not use if taking Viagra, Cialis, or Levitra.   Do not use if you have migraines or rosacea.    Recheck in 6 weeks If needed.

## 2020-09-14 ENCOUNTER — Other Ambulatory Visit: Payer: Self-pay | Admitting: Obstetrics & Gynecology

## 2020-09-14 DIAGNOSIS — Z1231 Encounter for screening mammogram for malignant neoplasm of breast: Secondary | ICD-10-CM

## 2020-09-16 ENCOUNTER — Ambulatory Visit
Admission: RE | Admit: 2020-09-16 | Discharge: 2020-09-16 | Disposition: A | Discharge status: Home / Self Care | Payer: BC Managed Care – PPO | Source: Ambulatory Visit | Attending: Obstetrics & Gynecology | Admitting: Obstetrics & Gynecology

## 2020-09-16 ENCOUNTER — Other Ambulatory Visit: Payer: Self-pay

## 2020-09-16 DIAGNOSIS — Z1231 Encounter for screening mammogram for malignant neoplasm of breast: Secondary | ICD-10-CM

## 2020-09-28 ENCOUNTER — Other Ambulatory Visit: Payer: Self-pay | Admitting: Physician Assistant

## 2020-10-14 ENCOUNTER — Encounter: Payer: Self-pay | Admitting: Physician Assistant

## 2020-10-14 MED ORDER — ATORVASTATIN CALCIUM 10 MG PO TABS
10.0000 mg | ORAL_TABLET | Freq: Every day | ORAL | 1 refills | Status: DC
Start: 2020-10-14 — End: 2020-12-07

## 2020-10-14 MED ORDER — VITAMIN D-1000 MAX ST 25 MCG (1000 UT) PO TABS
1000.0000 [IU] | ORAL_TABLET | Freq: Every day | ORAL | 3 refills | Status: DC
Start: 2020-10-14 — End: 2022-01-03

## 2020-10-23 NOTE — Progress Notes (Signed)
I, Holly Richardson, LAT, ATC, am serving as scribe for Dr. Clementeen Richardson.  Holly Richardson is a 59 y.o. female who presents to [facility] at [facility] today for f/u of L heel and Achille's pain.  She was last seen by Dr. Denyse Amass on 09/09/20 for f/u and noted improvement.  She was advised to con't her eccentric calf exercises and was prescribed nitroglycerin patches.  Since her last visit, pt reports her L heel and Achille's are doing so much better.  She has been doing her ecc exercises and using her nitroglycerin patches.  She would like to discuss her R shoulder that has been bothering her since April 2021 when she fell on her L side.  She locates her pain to her R ant/sup shoulder.  She denies any R UE radiating pain.  She reports some mechanical symptoms and popping in her R shoulder.  Aggravating factors include R shoulder AROM above shoulder level.  She has tried some R shoulder ROM exercises.  She has been taking Celebrex for her knees.  Diagnostic testing: L ankle XR- 08/26/20  Pertinent review of systems: No fevers or chills  Relevant historical information: Bipolar disorder   Exam:  BP 136/86 (BP Location: Left Arm, Patient Position: Sitting, Cuff Size: Normal)   Pulse 65   Ht 5\' 5"  (1.651 m)   Wt 190 lb 12.8 oz (86.5 kg)   SpO2 96%   BMI 31.75 kg/m  General: Well Developed, well nourished, and in no acute distress.   MSK: Left ankle normal motion normal gait.  Right shoulder normal-appearing Normal motion pain with abduction and internal rotation. Mildly tender palpation anterior shoulder. Intact strength abduction external and internal rotation. Positive Hawkins and Neer's test. Positive empty can test. Negative Yergason's and speeds test.    Lab and Radiology Results  Diagnostic Limited MSK Ultrasound of: Right shoulder Biceps tendon intact and normal appearing however hypoechoic fluid tracks within tendon sheath at proximal biceps tendon  indicating either biceps tendinitis or intra-articular shoulder problem. Subscapularis tendon is intact.  Small amount of hyperechoic change distal tendon insertion indicates chronic calcific tendinopathy. Supraspinatus tendon is intact.  Again hyperechoic change seen at distal tendon insertion indicating chronic calcific tendinopathy.  Hypoechoic change present superficial to tendon consistent with subacromial bursitis. Infraspinatus tendon also shows small amount of hyperechoic change at distal tendon insertion showing tendinopathy.  But is intact. AC joint narrowed and degenerative Impression: Chronic calcific rotator cuff tendinopathy, subacromial bursitis.      Assessment and Plan: 59 y.o. female with right shoulder pain due to subacromial bursitis, impingement, and rotator cuff tendinopathy.  Discussed options.  Plan for nitroglycerin patch protocol and home exercise program as taught in clinic today by ATC.  Left Achilles tendinopathy much improved with home exercise program and nitroglycerin patch protocol.  Plan to continue protocol.  Recheck 6 weeks.  [id number]; 15 additional minutes spent for Therapeutic exercises as stated in above notes.  This included exercises focusing on stretching, strengthening, with significant focus on eccentric aspects.   Long term goals include an improvement in range of motion, strength, endurance as well as avoiding reinjury. Patient's frequency would include in 1-2 times a day, 3-5 times a week for a duration of 6-12 weeks.  Proper technique shown and discussed handout in great detail with ATC.  All questions were discussed and answered.   PDMP not reviewed this encounter. Orders Placed This Encounter  Procedures  . 60 LIMITED JOINT SPACE STRUCTURES UP  RIGHT(NO LINKED CHARGES)    Order Specific Question:   Reason for Exam (SYMPTOM  OR DIAGNOSIS REQUIRED)    Answer:   R shoulder pain    Order Specific Question:   Preferred imaging location?    Answer:    [facility]   No orders of the defined types were placed in this encounter.    Discussed warning signs or symptoms. Please see discharge instructions. Patient expresses understanding.   The above documentation has been reviewed and is accurate and complete Holly Richardson, M.D.

## 2020-10-26 ENCOUNTER — Ambulatory Visit (INDEPENDENT_AMBULATORY_CARE_PROVIDER_SITE_OTHER): Payer: BC Managed Care – PPO | Admitting: Family Medicine

## 2020-10-26 ENCOUNTER — Other Ambulatory Visit: Payer: Self-pay

## 2020-10-26 ENCOUNTER — Encounter: Payer: Self-pay | Admitting: Family Medicine

## 2020-10-26 ENCOUNTER — Ambulatory Visit: Payer: Self-pay

## 2020-10-26 VITALS — BP 136/86 | HR 65 | Ht 65.0 in | Wt 190.8 lb

## 2020-10-26 DIAGNOSIS — M79672 Pain in left foot: Secondary | ICD-10-CM

## 2020-10-26 DIAGNOSIS — G8929 Other chronic pain: Secondary | ICD-10-CM

## 2020-10-26 DIAGNOSIS — M25511 Pain in right shoulder: Secondary | ICD-10-CM | POA: Diagnosis not present

## 2020-10-26 NOTE — Patient Instructions (Addendum)
Thank you for coming in today.  Continue the home exercises and nitro patches for the heel.   Add new home exercises for the shoulder and add another 1/4 patch to the shoulder.   Please perform the exercise program that we have prepared for you and gone over in detail on a daily basis.  In addition to the handout you were provided you can access your program through: [email or web address]   Your unique program code is:  [email or web address]   Recheck in about 6 weeks.   If not better next step could be injection vs MRI.   Ok to advance activity as tolerated.

## 2020-11-17 ENCOUNTER — Encounter: Payer: Self-pay | Admitting: Physician Assistant

## 2020-11-17 ENCOUNTER — Telehealth (INDEPENDENT_AMBULATORY_CARE_PROVIDER_SITE_OTHER): Payer: BC Managed Care – PPO | Admitting: Physician Assistant

## 2020-11-17 VITALS — Temp 97.3°F | Ht 65.0 in | Wt 181.0 lb

## 2020-11-17 DIAGNOSIS — U071 COVID-19: Secondary | ICD-10-CM

## 2020-11-17 NOTE — Progress Notes (Signed)
Virtual Visit via Video   I connected with Holly Richardson on 11/17/20 at  1:00 PM EST by a video enabled telemedicine application and verified that I am speaking with the correct person using two identifiers. Location patient: Home Location provider: [facility], Office Persons participating in the virtual visit: Shia, Delaine, [place],, Corky Mull, LPN   I discussed the limitations of evaluation and management by telemedicine and the availability of in person appointments. The patient expressed understanding and agreed to proceed.  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  Patient endorses the following symptoms: Headache, coughing with chest tightness, sore throat  Patient denies the following symptoms: No fever or chills, SOB, malaise, vomiting  Treatments tried: Motrin  Patient risk factors: Current COVID-19 risk of complications score: 2 -- HTN and obesity Smoking status: Holly Richardson  reports that she has never smoked. She has never used smokeless tobacco. If female, currently pregnant? []   Yes [x]   No  ROS: See pertinent positives and negatives per HPI.  Patient Active Problem List   Diagnosis Date Noted  . Irritable bowel syndrome with diarrhea 09/30/2019  . Chronic pain of right knee 07/30/2019  . Bipolar 2 disorder (HCC), Rx Prozac and Lithium 01/24/2019  . Eating disorder in remission, Hx of anorexia as a teen, struggles with emotional and binge eating as an adult, followed by Psych, RD; Intuitive Eating 01/24/2019  . Primary osteoarthritis of both knees, Rx Celebrex 11/28/2018  . Bloating 03/16/2017  . Insulin resistance, diet-controlled 10/24/2016  . Cardiac  murmur, 2016 ECHO: LVEF 60-65%, aortic valve sclerosis, trivial TR, ASCVD 2.8% 08/03/2015  . Essential hypertension, now controlled on no medication 05/31/2014  . Hyperlipidemia LDL goal <130, Rx Zocor and Fenofibrate 05/31/2014  . Recurrent major depressive disorder, in partial remission (HCC) 05/31/2014    Social History   Tobacco Use  . Smoking status: Never Smoker  . Smokeless tobacco: Never Used  Substance Use Topics  . Alcohol use: Yes    Alcohol/week: 7.0 standard drinks    Types: 7 Standard drinks or equivalent per week    Current Outpatient Medications:  .  ALPRAZolam (XANAX) 0.5 MG tablet, Take 0.25-0.5 mg by mouth daily as needed., Disp: , Rfl:  .  atorvastatin (LIPITOR) 10 MG tablet, Take 1 tablet (10 mg total) by mouth daily., Disp: 90 tablet, Rfl: 1 .  celecoxib (CELEBREX) 200 MG capsule, TAKE ONE (1) CAPSULE BY MOUTH 2 TIMES DAILY, Disp: 180 capsule, Rfl: 0 .  Cholecalciferol (VITAMIN D-1000 MAX ST) 25 MCG (1000 UT) tablet, Take 1 tablet (1,000 Units total) by mouth daily., Disp: 90 tablet, Rfl: 3 .  fenofibrate 160 MG tablet, TAKE ONE (1) TABLET BY MOUTH EVERY DAY, Disp: 30 tablet, Rfl: 1 .  FLUoxetine (PROZAC) 40 MG capsule, Take 1 capsule by mouth daily., Disp: , Rfl:  .  lithium carbonate 300 MG capsule, Take 300 mg by mouth 2 (two) times daily with a meal. , Disp: , Rfl:  .  nitroGLYCERIN (NITRODUR - DOSED IN MG/24 HR) 0.2 mg/hr patch, Apply 1/4 patch daily to tendon for tendonitis., Disp: 30 patch, Rfl: 1 .  valACYclovir (VALTREX) 1000 MG tablet, Take 2 tablets (2,000 mg  total) by mouth 2 (two) times daily., Disp: 8 tablet, Rfl: 0  Allergies  Allergen Reactions  . Codeine     Headache    Objective:   VITALS: Per patient if applicable, see vitals. GENERAL: Alert, appears well and in no acute distress. HEENT: Atraumatic, conjunctiva clear, no obvious abnormalities on inspection of external nose and ears. NECK: Normal movements of the head and  neck. CARDIOPULMONARY: No increased WOB. Speaking in clear sentences. I:E ratio WNL.  MS: Moves all visible extremities without noticeable abnormality. PSYCH: Pleasant and cooperative, well-groomed. Speech normal rate and rhythm. Affect is appropriate. Insight and judgement are appropriate. Attention is focused, linear, and appropriate.  NEURO: CN grossly intact. Oriented as arrived to appointment on time with no prompting. Moves both UE equally.  SKIN: No obvious lesions, wounds, erythema, or cyanosis noted on face or hands.  Assessment and Plan:   Cleatus was seen today for covid positive.  Diagnoses and all orders for this visit:  COVID-19   No red flags on discussion, patient is not in any obvious distress during our visit. Discussed progression of most viral illnesses, and recommended supportive care at this point in time.  I offered albuterol inhaler prn but she declined. Discussed over the counter supportive care options, including Tylenol 500 mg q 8 hours, with recommendations to push fluids and rest. Reviewed return precautions including new/worsening fever, SOB, new/worsening cough, sudden onset changes of symptoms. Recommended need to self-quarantine and practice social distancing until symptoms resolve. I recommend that patient follow-up if symptoms worsen or persist despite treatment x 7-10 days, sooner if needed.  I discussed the assessment and treatment plan with the patient. The patient was provided an opportunity to ask questions and all were answered. The patient agreed with the plan and demonstrated an understanding of the instructions.   The patient was advised to call back or seek an in-person evaluation if the symptoms worsen or if the condition fails to improve as anticipated.   CMA or LPN served as scribe during this visit. History, Physical, and Plan performed by medical provider. The above documentation has been reviewed and is accurate and complete.  [place], [place] 11/17/2020

## 2020-11-20 ENCOUNTER — Encounter: Payer: Self-pay | Admitting: Physician Assistant

## 2020-12-04 ENCOUNTER — Encounter: Payer: Self-pay | Admitting: Physician Assistant

## 2020-12-04 ENCOUNTER — Other Ambulatory Visit: Payer: Self-pay

## 2020-12-04 ENCOUNTER — Ambulatory Visit (INDEPENDENT_AMBULATORY_CARE_PROVIDER_SITE_OTHER): Payer: BC Managed Care – PPO | Admitting: Physician Assistant

## 2020-12-04 VITALS — BP 128/90 | HR 63 | Temp 97.2°F | Ht 65.0 in | Wt 190.0 lb

## 2020-12-04 DIAGNOSIS — Z0000 Encounter for general adult medical examination without abnormal findings: Secondary | ICD-10-CM

## 2020-12-04 DIAGNOSIS — F3181 Bipolar II disorder: Secondary | ICD-10-CM

## 2020-12-04 DIAGNOSIS — I10 Essential (primary) hypertension: Secondary | ICD-10-CM

## 2020-12-04 DIAGNOSIS — E785 Hyperlipidemia, unspecified: Secondary | ICD-10-CM | POA: Diagnosis not present

## 2020-12-04 LAB — CBC WITH DIFFERENTIAL/PLATELET
Basophils Absolute: 0 10*3/uL (ref 0.0–0.1)
Basophils Relative: 0.3 % (ref 0.0–3.0)
Eosinophils Absolute: 0.1 10*3/uL (ref 0.0–0.7)
Eosinophils Relative: 1.3 % (ref 0.0–5.0)
HCT: 39 % (ref 36.0–46.0)
Hemoglobin: 13.3 g/dL (ref 12.0–15.0)
Lymphocytes Relative: 24.1 % (ref 12.0–46.0)
Lymphs Abs: 1.5 10*3/uL (ref 0.7–4.0)
MCHC: 34 g/dL (ref 30.0–36.0)
MCV: 89.7 fl (ref 78.0–100.0)
Monocytes Absolute: 0.5 10*3/uL (ref 0.1–1.0)
Monocytes Relative: 7.6 % (ref 3.0–12.0)
Neutro Abs: 4.1 10*3/uL (ref 1.4–7.7)
Neutrophils Relative %: 66.7 % (ref 43.0–77.0)
Platelets: 301 10*3/uL (ref 150.0–400.0)
RBC: 4.35 Mil/uL (ref 3.87–5.11)
RDW: 13.5 % (ref 11.5–15.5)
WBC: 6.2 10*3/uL (ref 4.0–10.5)

## 2020-12-04 LAB — COMPREHENSIVE METABOLIC PANEL
ALT: 19 U/L (ref 0–35)
AST: 15 U/L (ref 0–37)
Albumin: 4.7 g/dL (ref 3.5–5.2)
Alkaline Phosphatase: 63 U/L (ref 39–117)
BUN: 16 mg/dL (ref 6–23)
CO2: 26 mEq/L (ref 19–32)
Calcium: 9.9 mg/dL (ref 8.4–10.5)
Chloride: 107 mEq/L (ref 96–112)
Creatinine, Ser: 0.64 mg/dL (ref 0.40–1.20)
GFR: 98.22 mL/min (ref >60.00)
Glucose, Bld: 97 mg/dL (ref 70–99)
Potassium: 4 mEq/L (ref 3.5–5.1)
Sodium: 141 mEq/L (ref 135–145)
Total Bilirubin: 0.5 mg/dL (ref 0.2–1.2)
Total Protein: 7 g/dL (ref 6.0–8.3)

## 2020-12-04 LAB — LIPID PANEL
Cholesterol: 221 mg/dL — ABNORMAL HIGH (ref 0–200)
HDL: 49.7 mg/dL (ref >39.00)
LDL Cholesterol: 136 mg/dL — ABNORMAL HIGH (ref 0–99)
NonHDL: 171.25
Total CHOL/HDL Ratio: 4
Triglycerides: 177 mg/dL — ABNORMAL HIGH (ref 0.0–149.0)
VLDL: 35.4 mg/dL (ref 0.0–40.0)

## 2020-12-04 NOTE — Progress Notes (Signed)
I acted as a Neurosurgeon for [employer], PA-C Corky Mull, LPN   Subjective:    Holly Richardson is a 60 y.o. female and is here for a comprehensive physical exam.   HPI  Health Maintenance Due  Topic Date Due  . COVID-19 Vaccine (3 - Booster for Pfizer series) 07/26/2020    Acute Concerns: None  Chronic Issues: HTN -- Currently not on medications. At home blood pressure readings are: <140/80. Patient denies chest pain, SOB, blurred vision, dizziness, unusual headaches, lower leg swelling. Denies excessive caffeine intake, stimulant usage, excessive alcohol intake, or increase in salt consumption.  BP Readings from Last 3 Encounters:  12/04/20 128/90  10/26/20 136/86  09/09/20 122/84   HLD -- stable on lipitor 10 mg daily.  Bipolar dx -- needs updated lithium level today for her psychiatrist. Currently takes prozac 40 mg daily and lithium 300 mg daily.  Health Maintenance: Immunizations -- UTD Colonoscopy -- UTD, due 11/2029 Mammogram -- UTD PAP -- UTD Bone Density -- N/A Diet -- overall healthy, sees RD regularly for hx of ED Exercise -- walks as able -- recovering from ankle pain Weight -- Weight: 190 lb (86.2 kg)  Mood -- stable Weight history: Wt Readings from Last 10 Encounters:  12/04/20 190 lb (86.2 kg)  11/17/20 181 lb (82.1 kg)  10/26/20 190 lb 12.8 oz (86.5 kg)  09/09/20 191 lb (86.6 kg)  08/26/20 189 lb 3.2 oz (85.8 kg)  08/25/20 183 lb 4 oz (83.1 kg)  07/21/20 183 lb 4 oz (83.1 kg)  02/25/20 184 lb 8 oz (83.7 kg)  02/11/20 184 lb 6.1 oz (83.6 kg)  10/29/19 182 lb (82.6 kg)   Body mass index is 31.62 kg/m. No LMP recorded. Patient is postmenopausal. Period characteristics: no concerns Alcohol use: limited intake Tobacco use: none  Depression screen PHQ 2/9 12/04/2020  Decreased Interest 0  Down, Depressed, Hopeless 0  PHQ - 2 Score 0  Altered sleeping -  Tired, decreased energy -  Change in appetite -  Feeling bad or failure  about yourself  -  Trouble concentrating -  Moving slowly or fidgety/restless -  Suicidal thoughts -  PHQ-9 Score -  Difficult doing work/chores -     Other providers/specialists: Patient Care Team: [name], [place] as PCP - General (Physician Assistant) Nahser, Deloris Ping, MD as Consulting Physician (Cardiology) Bjorn Pippin, MD as Attending Physician (Urology) Hollar, Ronal Fear, MD as Referring Physician (Dermatology) Jimmey Ralph Forest Becker, DO (Osteopathic Medicine) Mitchel Honour, DO as Consulting Physician (Obstetrics and Gynecology) Ivery Quale., DPM as Consulting Physician (Podiatry) Bjorn Pippin, MD as Consulting Physician (Orthopedic Surgery) Serita Kyle, PT as Physical Therapist (Physical Therapy) Algernon Huxley, [facility] as Counselor (Psychiatry) Charlott Rakes, MD as Consulting Physician (Gastroenterology) [name], [place] as Physician Assistant (Physician Assistant)    PMHx, SurgHx, SocialHx, Medications, and Allergies were reviewed in the Visit Navigator and updated as appropriate.   Past Medical History:  Diagnosis Date  . Anorexia 1982  . Anxiety   . Arthritis   . Depression   . Heart murmur   . History of chicken pox   . Hyperlipidemia   . Hypertension   . Kidney stones   . Vaginal delivery 1992     Past Surgical History:  Procedure Laterality Date  . ABDOMINAL SURGERY    . APPENDECTOMY    . Bladder Tack    . BREAST BIOPSY Left 2015  . CESAREAN SECTION  1994  .  TONSILLECTOMY AND ADENOIDECTOMY       Family History  Problem Relation Age of Onset  . CAD Mother   . Arthritis Mother   . Heart disease Mother   . Hypertension Mother   . Hypertension Father   . Heart disease Father   . Cystic fibrosis Son     Social History   Tobacco Use  . Smoking status: Never Smoker  . Smokeless tobacco: Never Used  Vaping Use  . Vaping Use: Never used  Substance Use Topics  . Alcohol use: Yes    Alcohol/week: 7.0 standard drinks     Types: 7 Standard drinks or equivalent per week    Comment: mostly less often  . Drug use: No    Review of Systems:   Review of Systems  Constitutional: Negative for chills, fever, malaise/fatigue and weight loss.  HENT: Negative for hearing loss, sinus pain and sore throat.   Eyes: Negative for blurred vision.  Respiratory: Negative for cough and shortness of breath.   Cardiovascular: Negative for chest pain, palpitations and leg swelling.  Gastrointestinal: Negative for abdominal pain, constipation, diarrhea, heartburn, nausea and vomiting.  Genitourinary: Negative for dysuria, frequency and urgency.  Musculoskeletal: Negative for back pain, myalgias and neck pain.  Skin: Negative for itching and rash.  Neurological: Negative for dizziness, tingling, seizures, loss of consciousness and headaches.  Endo/Heme/Allergies: Negative for polydipsia.  Psychiatric/Behavioral: Negative for depression. The patient is not nervous/anxious.   All other systems reviewed and are negative.   Objective:   BP 128/90 (BP Location: Left Arm, Patient Position: Sitting, Cuff Size: Large)   Pulse 63   Temp (!) 97.2 F (36.2 C) (Temporal)   Ht 5\' 5"  (1.651 m)   Wt 190 lb (86.2 kg)   SpO2 95%   BMI 31.62 kg/m  Body mass index is 31.62 kg/m.   General Appearance:    Alert, cooperative, no distress, appears stated age  Head:    Normocephalic, without obvious abnormality, atraumatic  Eyes:    PERRL, conjunctiva/corneas clear, EOM's intact, fundi    benign, both eyes  Ears:    Normal TM's and external ear canals, both ears  Nose:   Nares normal, septum midline, mucosa normal, no drainage    or sinus tenderness  Throat:   Lips, mucosa, and tongue normal; teeth and gums normal  Neck:   Supple, symmetrical, trachea midline, no adenopathy;    thyroid:  no enlargement/tenderness/nodules; no carotid   bruit or JVD  Back:     Symmetric, no curvature, ROM normal, no CVA tenderness  Lungs:     Clear to  auscultation bilaterally, respirations unlabored  Chest Wall:    No tenderness or deformity   Heart:    Regular rate and rhythm, S1 and S2 normal, no murmur, rub or gallop  Breast Exam:    Deferred  Abdomen:     Soft, non-tender, bowel sounds active all four quadrants,    no masses, no organomegaly  Genitalia:    Deferred  Extremities:   Extremities normal, atraumatic, no cyanosis or edema  Pulses:   2+ and symmetric all extremities  Skin:   Skin color, texture, turgor normal, no rashes or lesions  Lymph nodes:   Cervical, supraclavicular, and axillary nodes normal  Neurologic:   CNII-XII intact, normal strength, sensation and reflexes    throughout     Assessment/Plan:   Holly Richardson was seen today for annual exam.  Diagnoses and all orders for this visit:  Routine physical  examination Today patient counseled on age appropriate routine health concerns for screening and prevention, each reviewed and up to date or declined. Immunizations reviewed and up to date or declined. Labs ordered and reviewed. Risk factors for depression reviewed and negative. Hearing function and visual acuity are intact. ADLs screened and addressed as needed. Functional ability and level of safety reviewed and appropriate. Education, counseling and referrals performed based on assessed risks today. Patient provided with a copy of personalized plan for preventive services.  Hyperlipidemia LDL goal <130 Update blood work and adjust lipitor 10 mg daily as indicated. -     Lipid panel  Bipolar 2 disorder (HCC) Update lithium level per patient request. She will send results to psychiatrist. Follow-up with psychiatrist as indicated. -     CBC with Differential/Platelet -     Comprehensive metabolic panel -     Lithium level  Essential hypertension, now controlled on no medication Overall normotensive today.   Well Adult Exam: Labs ordered: Yes. Patient counseling was done. See below for items discussed. Follow up  in one year. Breast cancer screening: UTD. Cervical cancer screening: UTD   Patient Counseling: [x]    Nutrition: Stressed importance of moderation in sodium/caffeine intake, saturated fat and cholesterol, caloric balance, sufficient intake of fresh fruits, vegetables, fiber, calcium, iron, and 1 mg of folate supplement per day (for females capable of pregnancy).  [x]    Stressed the importance of regular exercise.   [x]    Substance Abuse: Discussed cessation/primary prevention of tobacco, alcohol, or other drug use; driving or other dangerous activities under the influence; availability of treatment for abuse.   [x]    Injury prevention: Discussed safety belts, safety helmets, smoke detector, smoking near bedding or upholstery.   [x]    Sexuality: Discussed sexually transmitted diseases, partner selection, use of condoms, avoidance of unintended pregnancy  and contraceptive alternatives.  [x]    Dental health: Discussed importance of regular tooth brushing, flossing, and dental visits.  [x]    Health maintenance and immunizations reviewed. Please refer to Health maintenance section.   CMA or LPN served as scribe during this visit. History, Physical, and Plan performed by medical provider. The above documentation has been reviewed and is accurate and complete.  , PA-C [facility]

## 2020-12-04 NOTE — Patient Instructions (Signed)
It was great to see you!  Please go to the lab for blood work.   Our office will call you with your results unless you have chosen to receive results via MyChart.  If your blood work is normal we will follow-up each year for physicals and as scheduled for chronic medical problems.  If anything is abnormal we will treat accordingly and get you in for a follow-up.  Take care,  Holly Richardson    Health Maintenance, Female Adopting a healthy lifestyle and getting preventive care are important in promoting health and wellness. Ask your health care provider about:  The right schedule for you to have regular tests and exams.  Things you can do on your own to prevent diseases and keep yourself healthy. What should I know about diet, weight, and exercise? Eat a healthy diet  Eat a diet that includes plenty of vegetables, fruits, low-fat dairy products, and lean protein.  Do not eat a lot of foods that are high in solid fats, added sugars, or sodium.   Maintain a healthy weight Body mass index (BMI) is used to identify weight problems. It estimates body fat based on height and weight. Your health care provider can help determine your BMI and help you achieve or maintain a healthy weight. Get regular exercise Get regular exercise. This is one of the most important things you can do for your health. Most adults should:  Exercise for at least 150 minutes each week. The exercise should increase your heart rate and make you sweat (moderate-intensity exercise).  Do strengthening exercises at least twice a week. This is in addition to the moderate-intensity exercise.  Spend less time sitting. Even light physical activity can be beneficial. Watch cholesterol and blood lipids Have your blood tested for lipids and cholesterol at 58 years of age, then have this test every 5 years. Have your cholesterol levels checked more often if:  Your lipid or cholesterol levels are high.  You are older than 58  years of age.  You are at high risk for heart disease. What should I know about cancer screening? Depending on your health history and family history, you may need to have cancer screening at various ages. This may include screening for:  Breast cancer.  Cervical cancer.  Colorectal cancer.  Skin cancer.  Lung cancer. What should I know about heart disease, diabetes, and high blood pressure? Blood pressure and heart disease  High blood pressure causes heart disease and increases the risk of stroke. This is more likely to develop in people who have high blood pressure readings, are of African descent, or are overweight.  Have your blood pressure checked: ? Every 3-5 years if you are 18-39 years of age. ? Every year if you are 40 years old or older. Diabetes Have regular diabetes screenings. This checks your fasting blood sugar level. Have the screening done:  Once every three years after age 40 if you are at a normal weight and have a low risk for diabetes.  More often and at a younger age if you are overweight or have a high risk for diabetes. What should I know about preventing infection? Hepatitis B If you have a higher risk for hepatitis B, you should be screened for this virus. Talk with your health care provider to find out if you are at risk for hepatitis B infection. Hepatitis C Testing is recommended for:  Everyone born from 1945 through 1965.  Anyone with known risk factors for hepatitis C.   Sexually transmitted infections (STIs)  Get screened for STIs, including gonorrhea and chlamydia, if: ? You are sexually active and are younger than 58 years of age. ? You are older than 58 years of age and your health care provider tells you that you are at risk for this type of infection. ? Your sexual activity has changed since you were last screened, and you are at increased risk for chlamydia or gonorrhea. Ask your health care provider if you are at risk.  Ask your health  care provider about whether you are at high risk for HIV. Your health care provider may recommend a prescription medicine to help prevent HIV infection. If you choose to take medicine to prevent HIV, you should first get tested for HIV. You should then be tested every 3 months for as long as you are taking the medicine. Pregnancy  If you are about to stop having your period (premenopausal) and you may become pregnant, seek counseling before you get pregnant.  Take 400 to 800 micrograms (mcg) of folic acid every day if you become pregnant.  Ask for birth control (contraception) if you want to prevent pregnancy. Osteoporosis and menopause Osteoporosis is a disease in which the bones lose minerals and strength with aging. This can result in bone fractures. If you are 65 years old or older, or if you are at risk for osteoporosis and fractures, ask your health care provider if you should:  Be screened for bone loss.  Take a calcium or vitamin D supplement to lower your risk of fractures.  Be given hormone replacement therapy (HRT) to treat symptoms of menopause. Follow these instructions at home: Lifestyle  Do not use any products that contain nicotine or tobacco, such as cigarettes, e-cigarettes, and chewing tobacco. If you need help quitting, ask your health care provider.  Do not use street drugs.  Do not share needles.  Ask your health care provider for help if you need support or information about quitting drugs. Alcohol use  Do not drink alcohol if: ? Your health care provider tells you not to drink. ? You are pregnant, may be pregnant, or are planning to become pregnant.  If you drink alcohol: ? Limit how much you use to 0-1 drink a day. ? Limit intake if you are breastfeeding.  Be aware of how much alcohol is in your drink. In the [place] one drink equals one 12 oz bottle of beer (355 mL), one 5 oz glass of wine (148 mL), or one 1 oz glass of hard liquor (44 mL). General  instructions  Schedule regular health, dental, and eye exams.  Stay current with your vaccines.  Tell your health care provider if: ? You often feel depressed. ? You have ever been abused or do not feel safe at home. Summary  Adopting a healthy lifestyle and getting preventive care are important in promoting health and wellness.  Follow your health care provider's instructions about healthy diet, exercising, and getting tested or screened for diseases.  Follow your health care provider's instructions on monitoring your cholesterol and blood pressure. This information is not intended to replace advice given to you by your health care provider. Make sure you discuss any questions you have with your health care provider. Document Revised: 10/17/2018 Document Reviewed: 10/17/2018 Elsevier Patient Education  2021 Elsevier Inc.  

## 2020-12-05 LAB — LITHIUM LEVEL: Lithium Lvl: 0.9 mmol/L (ref 0.6–1.2)

## 2020-12-07 ENCOUNTER — Other Ambulatory Visit: Payer: Self-pay | Admitting: Physician Assistant

## 2020-12-07 ENCOUNTER — Encounter: Payer: Self-pay | Admitting: Physician Assistant

## 2020-12-07 MED ORDER — ATORVASTATIN CALCIUM 20 MG PO TABS: 20.0000 mg | ORAL_TABLET | Freq: Every day | ORAL | 1 refills | Status: DC

## 2020-12-08 NOTE — Progress Notes (Signed)
° °  I, Philbert Riser, LAT, ATC acting as a scribe for [name], MD.  Holly Richardson is a 60 y.o. female who presents to [facility] at [facility] today for f/u chronic right shoulder pain due to subacromial bursitis, impingement, and RC tendinopathy that's been ongoing since April 2021. Pt was last seen by Dr. Denyse Amass on 10/26/20 and was advised to do HEP and nitro patches. Today, pt reports shoulder is doing pretty good. Every once is awhile she will experience a pull/pain.   Pertinent review of systems: No fevers or chills  Relevant historical information: Depression, hypertension, recent Covid.  Achilles tendinitis.   Exam:  BP 134/84 (BP Location: Right Arm, Patient Position: Sitting, Cuff Size: Normal)    Pulse 74    Ht 5\' 5"  (1.651 m)    Wt 186 lb 3.2 oz (84.5 kg)    SpO2 97%    BMI 30.99 kg/m  General: Well Developed, well nourished, and in no acute distress.   MSK: Right shoulder normal-appearing nontender normal motion normal strength negative impingement testing.    Assessment and Plan: 60 y.o. female with right shoulder pain thought to be due to tendinopathy.  Significant improvement with home exercise program and nitroglycerin patches.  Plan to continue home exercise program and recheck back as needed.  Precautions reviewed with patient.   PDMP not reviewed this encounter. No orders of the defined types were placed in this encounter.  No orders of the defined types were placed in this encounter.    Discussed warning signs or symptoms. Please see discharge instructions. Patient expresses understanding.   The above documentation has been reviewed and is accurate and complete 60, M.D.   Total encounter time 20 minutes including face-to-face time with the patient and, reviewing past medical record, and charting on the date of service.   Plan options and backup plan

## 2020-12-09 ENCOUNTER — Ambulatory Visit: Payer: BC Managed Care – PPO | Admitting: Family Medicine

## 2020-12-09 ENCOUNTER — Other Ambulatory Visit: Payer: Self-pay

## 2020-12-09 VITALS — BP 134/84 | HR 74 | Ht 65.0 in | Wt 186.2 lb

## 2020-12-09 DIAGNOSIS — M25511 Pain in right shoulder: Secondary | ICD-10-CM | POA: Diagnosis not present

## 2020-12-09 DIAGNOSIS — G8929 Other chronic pain: Secondary | ICD-10-CM | POA: Diagnosis not present

## 2020-12-09 NOTE — Patient Instructions (Signed)
Thank you for coming in today.  Continue the home exercises as needed.   Recheck with me for this or other issues as needed in the future.

## 2020-12-30 ENCOUNTER — Encounter: Payer: Self-pay | Admitting: Physician Assistant

## 2021-03-23 NOTE — Progress Notes (Signed)
I, Holly Richardson, LAT, ATC acting as a scribe for Holly Graham, MD. This visit occurred during the SARS-CoV-2 public health emergency.  Safety protocols were in place, including screening questions prior to the visit, additional usage of staff PPE, and extensive cleaning of exam room while observing appropriate contact time as indicated for disinfecting solutions.   Holly Richardson is a 60 y.o. female who presents to [facility] at [facility] today for continued L heel pain. Pt was previously seen by Dr. Denyse Richardson on 10/26/20 for this pathology and was advised to treat w/ nitro patches and HEP. Today, pt reports that she has been having a flare of her pain in the achilles tendon. Burning radiating pain into the calcaneous. Has tried rest, ice and cam walker. Does not like to use cam walker due to pain that develops in hips. States that nitro patches are no longer helping and are making skin on her foot raw. Patient notes standing all day at school.   Also having bilateral hand pain. Patient has marked on her skin where she is experiencing burning pain.   Dx imaging: 08/26/20 L ankle XR  Pertinent review of systems: No fevers or chills  Relevant historical information: Bipolar disorder managed with lithium   Exam:  BP 130/90   Pulse 69   Ht 5\' 5"  (1.651 m)   Wt 193 lb (87.5 kg)   SpO2 99%   BMI 32.12 kg/m  General: Well Developed, well nourished, and in no acute distress.   MSK: Left posterior calcaneus slightly swollen.  Tender palpation.  Normal ankle motion.  Intact strength. Pulses cap refill and sensation are intact distally.    Lab and Radiology Results  EXAM: LEFT ANKLE COMPLETE - 3+ VIEW  COMPARISON:  None.  FINDINGS: There is no evidence of an acute fracture, dislocation, or joint effusion. A chronic deformity is seen involving the left medial malleolus. A moderate size plantar calcaneal spur is noted. There is no evidence of arthropathy or  other focal bone abnormality. Soft tissues are unremarkable.  IMPRESSION: No acute osseous abnormality.   Electronically Signed   By: M.D.   On: 08/27/2020 22:12  I, 08/29/2020, personally (independently) visualized and performed the interpretation of the images attached in this note.  Diagnostic Limited MSK Ultrasound of: Left Achilles tendon Achilles tendon is intact. Insertion onto calcaneus shows areas of hyperechoic change consistent with calcific tendinopathy. Areas of increased vascularity on Doppler ultrasound present superficial to tendon. Impression: Calcific Achilles tendinopathy     Assessment and Plan: 60 y.o. female with patient has thickened pain in the left calcaneus due to calcific Achilles tendinopathy.  She is failing conservative management.  She has tried night splint and home exercise program and nitroglycerin patch protocol and even a cam walker boot.  These have all helped some but she has persistently significant pain that is significantly interfering with her quality of life and normal activities.  At this point next conservative treatment options are somewhat marginal and could include extracorporeal shockwave therapy, PRP injection and ultimately surgery.  Plan to proceed with MRI to further characterize cause of pain and for potential treatment planning.  PDMP not reviewed this encounter. Orders Placed This Encounter  Procedures  . 60 LIMITED JOINT SPACE STRUCTURES LOW LEFT(NO LINKED CHARGES)    Order Specific Question:   Reason for Exam (SYMPTOM  OR DIAGNOSIS REQUIRED)    Answer:   left heel pain    Order Specific  Question:   Preferred imaging location?    Answer:   Adult nurse [facility]  . [place] LIMITED JOINT SPACE STRUCTURES LOW LEFT(NO LINKED CHARGES)    Order Specific Question:   Reason for Exam (SYMPTOM  OR DIAGNOSIS REQUIRED)    Answer:   left heel pain    Order Specific Question:   Preferred imaging location?     Answer:   Adult nurse [facility]  . MR ANKLE LEFT WO CONTRAST    Standing Status:   Future    Standing Expiration Date:   03/24/2022    Order Specific Question:   What is the patient's sedation requirement?    Answer:   No Sedation    Order Specific Question:   Does the patient have a pacemaker or implanted devices?    Answer:   No    Order Specific Question:   Preferred imaging location?    Answer:   Licensed conveyancer (table limit-350lbs)   No orders of the defined types were placed in this encounter.    Discussed warning signs or symptoms. Please see discharge instructions. Patient expresses understanding.   The above documentation has been reviewed and is accurate and complete Holly Richardson, M.D.

## 2021-03-24 ENCOUNTER — Other Ambulatory Visit: Payer: Self-pay

## 2021-03-24 ENCOUNTER — Ambulatory Visit: Payer: Self-pay

## 2021-03-24 ENCOUNTER — Ambulatory Visit (INDEPENDENT_AMBULATORY_CARE_PROVIDER_SITE_OTHER): Payer: BC Managed Care – PPO | Admitting: Family Medicine

## 2021-03-24 ENCOUNTER — Encounter: Payer: Self-pay | Admitting: Family Medicine

## 2021-03-24 VITALS — BP 130/90 | HR 69 | Ht 65.0 in | Wt 193.0 lb

## 2021-03-24 DIAGNOSIS — M7662 Achilles tendinitis, left leg: Secondary | ICD-10-CM | POA: Diagnosis not present

## 2021-03-24 DIAGNOSIS — M79672 Pain in left foot: Secondary | ICD-10-CM | POA: Diagnosis not present

## 2021-03-24 NOTE — Patient Instructions (Signed)
Thank you for coming in today.  You should hear from MRI scheduling within 1 week. If you do not hear please let me know.   Recheck after the MRI.   Ok to continue the home exercises in the mean time.

## 2021-04-10 ENCOUNTER — Ambulatory Visit (INDEPENDENT_AMBULATORY_CARE_PROVIDER_SITE_OTHER): Payer: BC Managed Care – PPO

## 2021-04-10 ENCOUNTER — Other Ambulatory Visit: Payer: Self-pay

## 2021-04-10 DIAGNOSIS — M7662 Achilles tendinitis, left leg: Secondary | ICD-10-CM

## 2021-04-10 DIAGNOSIS — M79672 Pain in left foot: Secondary | ICD-10-CM

## 2021-04-14 ENCOUNTER — Ambulatory Visit: Payer: BC Managed Care – PPO | Admitting: Family Medicine

## 2021-04-18 NOTE — Progress Notes (Signed)
Holly Richardson, am serving as a Neurosurgeon for Dr. [name].   Holly Richardson is a 60 y.o. female who presents to [facility] at [facility] today for f/u L heel pain. Pt was last seen by Dr. Denyse Amass on 03/24/21 and was advised to proceed to MRI as she has failed conservative management. Today, pt reports that she has been wearing tha CAM boot and got the level up for the other shoe to help when wearing the boot. When not wearing the boot will wear a compression sleeve on the ankle to help. Does notice on the days she over does it that she will get the burning sensation. States that she has not been doing the exercises because it felt like a pulling sensation and didn't want to make anything worse. Patient does states that both her wrists are doing great. She wears her braces at night and that helps a lot.   Dx imaging: 04/10/21 L ankle MRI  08/26/20 L ankle XR  Pertinent review of systems: No fevers or chills  Relevant historical information: Bipolar   Exam:  BP 116/80 (BP Location: Left Arm, Patient Position: Sitting, Cuff Size: Normal)   Pulse 66   Ht 5\' 5"  (1.651 m)   Wt 193 lb (87.5 kg)   SpO2 97%   BMI 32.12 kg/m  General: Well Developed, well nourished, and in no acute distress.   MSK: Normal left ankle motion    Lab and Radiology Results  MR ANKLE LEFT WO CONTRAST  Addendum Date: 04/19/2021   ADDENDUM REPORT: 04/19/2021 11:00 ADDENDUM: Voice recognition error in the original dictation. Correction should read as follows: Achilles: Mild tendinosis of the Achilles tendon without a tear. Electronically Signed   By: 04/21/2021   On: 04/19/2021 11:00   Result Date: 04/19/2021 CLINICAL DATA:  Left Achilles pain radiating into the calcaneus EXAM: MRI OF THE LEFT ANKLE WITHOUT CONTRAST TECHNIQUE: Multiplanar, multisequence MR imaging of the ankle was performed. No intravenous contrast was administered. COMPARISON:  None. FINDINGS: TENDONS Peroneal:  Peroneal longus tendon intact. Mild tendinosis of the peroneus brevis. Posteromedial: Posterior tibial tendon intact. Flexor hallucis longus tendon intact. Flexor digitorum longus tendon intact. Anterior: Tibialis anterior tendon intact. Extensor hallucis longus tendon intact Extensor digitorum longus tendon intact. Achilles:  Mild tendinosis of the Achilles tendon a tear. Plantar Fascia: Plantar calcaneal spur. LIGAMENTS Lateral: Anterior talofibular ligament intact. Calcaneofibular ligament intact. Posterior talofibular ligament intact. Anterior and posterior tibiofibular ligaments intact. Medial: Deltoid ligament intact. Spring ligament intact. CARTILAGE Ankle Joint: No joint effusion. Normal ankle mortise. No chondral defect. Subtalar Joints/Sinus Tarsi: Normal subtalar joints. No subtalar joint effusion. Normal sinus tarsi. Bones: No marrow signal abnormality.  No fracture or dislocation. Soft Tissue: No fluid collection or hematoma. Muscles are normal without edema or atrophy. Tarsal tunnel is normal. IMPRESSION: 1. Mild tendinosis of the Achilles tendon a tear. 2. Mild tendinosis of the peroneus brevis. Electronically Signed: By: 04/21/2021 On: 04/11/2021 07:21   06/11/2021 LIMITED JOINT SPACE STRUCTURES LOW LEFT(NO LINKED CHARGES)  Result Date: 03/28/2021   Diagnostic Limited MSK Ultrasound of: Left Achilles tendon Achilles tendon is intact. Insertion onto calcaneus shows areas of hyperechoic change consistent with calcific tendinopathy. Areas of increased vascularity on Doppler ultrasound present superficial to tendon. Impression: Calcific Achilles tendinopathy  03/30/2021 LIMITED JOINT SPACE STRUCTURES LOW LEFT(NO LINKED CHARGES)  Result Date: 03/26/2021 Diagnostic Limited MSK Ultrasound of: Left Achilles tendon Achilles tendon is intact. Insertion onto calcaneus shows  areas of hyperechoic change consistent with calcific tendinopathy. Areas of increased vascularity on Doppler ultrasound present superficial to  tendon. Impression: Calcific Achilles tendinopathy   I, [name], personally (independently) visualized and performed the interpretation of the MRI images attached in this note.      Assessment and Plan: 60 y.o. female with left Achilles tendon pain thought to be due to insertional calcific tendinopathy.  MRI confirms this diagnosis does not show significant tear.  Fortunately she has done pretty well in the last month by using a cam walker boot intermittently.  Previously she was unable to use a cam walker boot because of hip pain.  However her physical therapist recommended Evenup shoe to use on the other foot which evens out the leg length difference with a cam walker boot.  This allows her to use the cam walker boot much more comfortably.  With this intermittently she has done pretty well.  Discussed treatment options.  She would like to proceed with continued conservative management for now.  Next steps include extracorporeal shockwave therapy, plan to rich plasma, and ultimately surgery.  Patient will keep me updated.  Check back as needed.    Discussed warning signs or symptoms. Please see discharge instructions. Patient expresses understanding.   The above documentation has been reviewed and is accurate and complete [name], M.D. Total encounter time 20 minutes including face-to-face time with the patient and, reviewing past medical record, and charting on the date of service.   Treatment plan and options.

## 2021-04-19 ENCOUNTER — Ambulatory Visit: Payer: BC Managed Care – PPO | Admitting: Family Medicine

## 2021-04-19 ENCOUNTER — Other Ambulatory Visit: Payer: Self-pay

## 2021-04-19 ENCOUNTER — Encounter: Payer: Self-pay | Admitting: Family Medicine

## 2021-04-19 VITALS — BP 116/80 | HR 66 | Ht 65.0 in | Wt 193.0 lb

## 2021-04-19 DIAGNOSIS — M7662 Achilles tendinitis, left leg: Secondary | ICD-10-CM

## 2021-04-19 NOTE — Patient Instructions (Signed)
Thank you for coming in today.   This can smolder along for a long time.  Keep working on it.   Use the boot as needed.  But try to spend less time in it.   Work on those exercises.   Next step is Shock wave therapy.   Next step after that is platelet rich plasma injection (PRP).   Next step after that is probably surgery.

## 2021-04-28 ENCOUNTER — Other Ambulatory Visit: Payer: Self-pay | Admitting: Physician Assistant

## 2021-05-21 NOTE — Progress Notes (Signed)
I, Christoper Fabian, LAT, ATC, am serving as scribe for Dr. [name].  Holly Richardson is a 60 y.o. female who presents to [facility] at [facility] today for f/u of L heel / Achille's pain.  She was last seen by Dr. Denyse Amass on 04/19/21 and noted some improvement.  She was advised to con't using her CAM walker boot as needed and to con't PT/HEP.  Since her last visit, pt reports her heel pain has worsened. Pt tried to work w/ a Teacher, adult education. Pt c/o burning pain on the posterior aspect of calcaneous.  Diagnostic imaging: L ankle MRI- 04/10/21; L ankle XR- 08/26/20  Pertinent review of systems: no fever or chills  Relevant historical information: Hx prior eating disorder   Exam:  BP (!) 144/90 (BP Location: Right Arm, Patient Position: Sitting, Cuff Size: Normal)   Pulse 76   Ht 5\' 5"  (1.651 m)   Wt 188 lb (85.3 kg)   SpO2 98%   BMI 31.28 kg/m  General: Well Developed, well nourished, and in no acute distress.   MSK: left heel. TTP post calcaneous     Lab and Radiology Results  ADDENDUM REPORT: 04/19/2021 11:00   ADDENDUM: Voice recognition error in the original dictation.   Correction should read as follows:   Achilles: Mild tendinosis of the Achilles tendon without a tear.     Electronically Signed   By: 04/21/2021   On: 04/19/2021 11:00    Addended by 04/21/2021, MD on 04/19/2021 11:02 AM    Study Result  Narrative & Impression  CLINICAL DATA:  Left Achilles pain radiating into the calcaneus   EXAM: MRI OF THE LEFT ANKLE WITHOUT CONTRAST   TECHNIQUE: Multiplanar, multisequence MR imaging of the ankle was performed. No intravenous contrast was administered.   COMPARISON:  None.   FINDINGS: TENDONS   Peroneal: Peroneal longus tendon intact. Mild tendinosis of the peroneus brevis.   Posteromedial: Posterior tibial tendon intact. Flexor hallucis longus tendon intact. Flexor digitorum longus tendon intact.   Anterior:  Tibialis anterior tendon intact. Extensor hallucis longus tendon intact Extensor digitorum longus tendon intact.   Achilles:  Mild tendinosis of the Achilles tendon a tear.   Plantar Fascia: Plantar calcaneal spur.   LIGAMENTS   Lateral: Anterior talofibular ligament intact. Calcaneofibular ligament intact. Posterior talofibular ligament intact. Anterior and posterior tibiofibular ligaments intact.   Medial: Deltoid ligament intact. Spring ligament intact.   CARTILAGE   Ankle Joint: No joint effusion. Normal ankle mortise. No chondral defect.   Subtalar Joints/Sinus Tarsi: Normal subtalar joints. No subtalar joint effusion. Normal sinus tarsi.   Bones: No marrow signal abnormality.  No fracture or dislocation.   Soft Tissue: No fluid collection or hematoma. Muscles are normal without edema or atrophy. Tarsal tunnel is normal.   IMPRESSION: 1. Mild tendinosis of the Achilles tendon a tear. 2. Mild tendinosis of the peroneus brevis.   Electronically Signed: By: 04/21/2021 On: 04/11/2021 07:21     I, 06/11/2021, personally (independently) visualized and performed the interpretation of the images attached in this note.     Assessment and Plan: 60 y.o. female with left AT tendonitis and pain. Failing conservative management. Plan for surgical consultation. We discussed PRP and she would like to talk with the surgeon first.  Return PRN.   Referral placed today.    PDMP not reviewed this encounter. Orders Placed This Encounter  Procedures   Ambulatory referral to Orthopedic Surgery  Referral Priority:   Routine    Referral Type:   Surgical    Referral Reason:   Specialty Services Required    Referred to Provider:   Nadara Mustard, MD    Requested Specialty:   Orthopedic Surgery    Number of Visits Requested:   1   No orders of the defined types were placed in this encounter.    Discussed warning signs or symptoms. Please see discharge instructions. Patient  expresses understanding.   The above documentation has been reviewed and is accurate and complete [name], M.D.  Total encounter time 20 minutes including face-to-face time with the patient and, reviewing past medical record, and charting on the date of service.   Treatment options

## 2021-05-24 ENCOUNTER — Other Ambulatory Visit: Payer: Self-pay

## 2021-05-24 ENCOUNTER — Ambulatory Visit (INDEPENDENT_AMBULATORY_CARE_PROVIDER_SITE_OTHER): Payer: BC Managed Care – PPO | Admitting: Family Medicine

## 2021-05-24 VITALS — BP 144/90 | HR 76 | Ht 65.0 in | Wt 188.0 lb

## 2021-05-24 DIAGNOSIS — M7662 Achilles tendinitis, left leg: Secondary | ICD-10-CM | POA: Diagnosis not present

## 2021-05-24 NOTE — Patient Instructions (Signed)
Thank you for coming in today.   You should hear from Dr Audrie Lia office soon.   If you cannot get an appointment in a timely manner let me know. Also let me know if any other problem.

## 2021-05-27 ENCOUNTER — Ambulatory Visit: Payer: BC Managed Care – PPO | Admitting: Orthopedic Surgery

## 2021-05-27 ENCOUNTER — Other Ambulatory Visit: Payer: Self-pay

## 2021-05-27 DIAGNOSIS — M7662 Achilles tendinitis, left leg: Secondary | ICD-10-CM | POA: Diagnosis not present

## 2021-06-06 ENCOUNTER — Encounter: Payer: Self-pay | Admitting: Orthopedic Surgery

## 2021-06-06 NOTE — Progress Notes (Signed)
Office Visit Note   Patient: Holly Richardson           Date of Birth: [birth year]           MRN: [id number] Visit Date: 05/27/2021              Requested by: Rodolph Bong, MD [address] [place],  [place] [address] PCP: [name], [place]  Chief Complaint  Patient presents with   Left Achilles Tendon - Pain      HPI: Patient is a 60 year old woman presents with 1 year history of left Achilles tendinitis.  Patient has undergone excellent conservative therapy including a fracture boot physical therapy massage nitroglycerin patch and anti-inflammatories without relief.  She has had an MRI scan in June.  Patient works as a Midwife.  Assessment & Plan: Visit Diagnoses:  1. Achilles tendinitis, left leg     Plan: Discussed that 1 surgical treatment option would be to proceed with a gastrocnemius recession to unload the Achilles tendon.  Risks and benefits were discussed patient states she understands and would like to proceed with gastrocnemius recession.  Follow-Up Instructions: Return if symptoms worsen or fail to improve.   Ortho Exam  Patient is alert, oriented, no adenopathy, well-dressed, normal affect, normal respiratory effort. Examination patient has a good dorsalis pedis pulse she has good ankle good subtalar motion she has dorsiflexion only to neutral.  She has tenderness to palpation along the Achilles tendon there are no nodules no palpable defects no swelling.  Imaging: No results found. No images are attached to the encounter.  Labs: Lab Results  Component Value Date   HGBA1C 5.5 07/30/2019   HGBA1C 5.5 01/22/2019   REPTSTATUS 01/04/2015 FINAL 01/02/2015   CULT  01/02/2015    Multiple bacterial morphotypes present, none predominant. Suggest appropriate recollection if clinically indicated. Performed at [facility]      Lab Results  Component Value Date   ALBUMIN 4.7 12/04/2020   ALBUMIN 4.4 02/11/2020   ALBUMIN 4.7  10/29/2019    No results found for: MG Lab Results  Component Value Date   VD25OH 33 08/06/2018    No results found for: PREALBUMIN CBC EXTENDED Latest Ref Rng & Units 12/04/2020 02/11/2020 10/29/2019  WBC 4.0 - 10.5 K/uL 6.2 5.8 5.5  RBC 3.87 - 5.11 Mil/uL 4.35 4.19 4.51  HGB 12.0 - 15.0 g/dL 15.4 00.8 67.6  HCT 19.5 - 46.0 % 39.0 38.6 41.3  PLT 150.0 - 400.0 K/uL 301.0 270.0 304.0  NEUTROABS 1.4 - 7.7 K/uL 4.1 3.4 3.3  LYMPHSABS 0.7 - 4.0 K/uL 1.5 1.8 1.6     There is no height or weight on file to calculate BMI.  Orders:  No orders of the defined types were placed in this encounter.  No orders of the defined types were placed in this encounter.    Procedures: No procedures performed  Clinical Data: No additional findings.  ROS:  All other systems negative, except as noted in the HPI. Review of Systems  Objective: Vital Signs: There were no vitals taken for this visit.  Specialty Comments:  No specialty comments available.  PMFS History: Patient Active Problem List   Diagnosis Date Noted   Irritable bowel syndrome with diarrhea 09/30/2019   Chronic pain of right knee 07/30/2019   Bipolar 2 disorder (HCC), Rx Prozac and Lithium 01/24/2019   Eating disorder in remission, Hx of anorexia as a teen, struggles with emotional and binge eating as an  adult, followed by Psych, RD; Intuitive Eating 01/24/2019   Primary osteoarthritis of both knees, Rx Celebrex 11/28/2018   Bloating 03/16/2017   Insulin resistance, diet-controlled 10/24/2016   Cardiac murmur, 2016 ECHO: LVEF 60-65%, aortic valve sclerosis, trivial TR, ASCVD 2.8% 08/03/2015   Essential hypertension, now controlled on no medication 05/31/2014   Hyperlipidemia LDL goal <130, Rx Zocor and Fenofibrate 05/31/2014   Recurrent major depressive disorder, in partial remission (HCC) 05/31/2014   Past Medical History:  Diagnosis Date   Anorexia 1982   Anxiety    Arthritis    Depression    Heart murmur     History of chicken pox    Hyperlipidemia    Hypertension    Kidney stones    Vaginal delivery 1992    Family History  Problem Relation Age of Onset   CAD Mother    Arthritis Mother    Heart disease Mother    Hypertension Mother    Hypertension Father    Heart disease Father    Cystic fibrosis Son     Past Surgical History:  Procedure Laterality Date   ABDOMINAL SURGERY     APPENDECTOMY     Bladder Tack     BREAST BIOPSY Left 2015   CESAREAN SECTION  1994   TONSILLECTOMY AND ADENOIDECTOMY     Social History   Occupational History    Employer: [employer]  Tobacco Use   Smoking status: Never   Smokeless tobacco: Never  Vaping Use   Vaping Use: Never used  Substance and Sexual Activity   Alcohol use: Yes    Alcohol/week: 7.0 standard drinks    Types: 7 Standard drinks or equivalent per week    Comment: mostly less often   Drug use: No   Sexual activity: Not Currently

## 2021-10-25 ENCOUNTER — Encounter: Payer: Self-pay | Admitting: Physician Assistant

## 2021-10-26 ENCOUNTER — Telehealth: Payer: BC Managed Care – PPO | Admitting: Physician Assistant

## 2021-10-26 ENCOUNTER — Encounter: Payer: Self-pay | Admitting: Physician Assistant

## 2021-10-26 VITALS — Ht 65.0 in | Wt 178.0 lb

## 2021-10-26 DIAGNOSIS — R051 Acute cough: Secondary | ICD-10-CM

## 2021-10-26 MED ORDER — AZITHROMYCIN 250 MG PO TABS: ORAL_TABLET | ORAL | 0 refills | Status: AC

## 2021-10-26 NOTE — Progress Notes (Signed)
I acted as a Neurosurgeon for [employer], PA-C Corky Mull, LPN   Virtual Visit via Video Note   I, [name], connected with  Holly Richardson  ([id number], 1961/08/20) on 10/26/21 at  9:30 AM EST by a video-enabled telemedicine application and verified that I am speaking with the correct person using two identifiers.  Location: Patient: Virtual Visit Location Patient: Home Provider: Virtual Visit Location Provider: Office/Clinic   I discussed the limitations of evaluation and management by telemedicine and the availability of in person appointments. The patient expressed understanding and agreed to proceed.    History of Present Illness: Holly Richardson is a 60 y.o. who identifies as a female who was assigned female at birth, and is being seen today for Cough. Pt c/o head congestion/ pressure, slight dizziness, cough and expectorating green/ brown sputum, fatigue, chills off and on. Started on Friday. COVID test done x 2 both Neg. She is taking Tylenol. 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  Problems:  Patient Active Problem List   Diagnosis Date Noted   Irritable bowel syndrome with diarrhea 09/30/2019   Chronic pain of right knee 07/30/2019   Bipolar 2 disorder (HCC), Rx Prozac and Lithium 01/24/2019   Eating disorder in remission, Hx of anorexia as a teen, struggles with emotional and binge eating as an adult, followed by Psych, RD; Intuitive Eating 01/24/2019   Primary osteoarthritis of both knees, Rx Celebrex 11/28/2018   Bloating 03/16/2017   Insulin resistance, diet-controlled 10/24/2016   Cardiac murmur, 2016 ECHO: LVEF 60-65%, aortic valve sclerosis, trivial TR, ASCVD 2.8% 08/03/2015   Essential hypertension, now controlled on no medication 05/31/2014   Hyperlipidemia LDL goal <130, Rx Zocor and Fenofibrate 05/31/2014   Recurrent major depressive disorder, in partial remission (HCC) 05/31/2014     Allergies:  Allergies  Allergen Reactions   Codeine     Headache   Medications:  Current Outpatient Medications:    ALPRAZolam (XANAX) 0.5 MG tablet, Take 0.25-0.5 mg by mouth daily as needed., Disp: , Rfl:    atorvastatin (LIPITOR) 20 MG tablet, Take 1 tablet (20 mg total) by mouth daily., Disp: 90 tablet, Rfl: 1   azithromycin (ZITHROMAX) 250 MG tablet, Take 2 tablets on day 1, then 1 tablet daily on days 2 through 5, Disp: 6 tablet, Rfl: 0   celecoxib (CELEBREX) 200 MG capsule, TAKE ONE (1) CAPSULE BY MOUTH 2 TIMES DAILY, Disp: 180 capsule, Rfl: 0   Cholecalciferol (VITAMIN D-1000 MAX ST) 25 MCG (1000 UT) tablet, Take 1 tablet (1,000 Units total) by mouth daily., Disp: 90 tablet, Rfl: 3   fenofibrate 160 MG tablet, TAKE ONE (1) TABLET BY MOUTH EVERY DAY, Disp: 30 tablet, Rfl: 5   FLUoxetine (PROZAC) 40 MG capsule, Take 1 capsule by mouth daily., Disp: , Rfl:    lithium carbonate 300 MG capsule, Take 300 mg by mouth 2 (two) times daily with a meal. , Disp: , Rfl:    valACYclovir (VALTREX) 1000 MG tablet, Take 2 tablets (2,000 mg total) by mouth 2 (two) times daily., Disp: 8 tablet, Rfl: 0  Observations/Objective: Patient is well-developed, well-nourished in no acute distress.  Resting comfortably  at home.  Head is normocephalic, atraumatic.  No labored breathing.  Speech is clear and coherent with logical content.  Patient is alert and oriented at baseline.    Assessment and Plan: 1. Acute cough No red flags on  exam.  Will initiate azithromcyin per orders. Discussed taking medications as prescribed. Reviewed return precautions including worsening fever, SOB, worsening cough or other concerns. Push fluids and rest. I recommend that patient follow-up if symptoms worsen or persist despite treatment x 7-10 days, sooner if needed.   Follow Up Instructions: I discussed the assessment and treatment plan with the patient. The patient was provided an opportunity to ask questions and all  were answered. The patient agreed with the plan and demonstrated an understanding of the instructions.  A copy of instructions were sent to the patient via MyChart unless otherwise noted below.   The patient was advised to call back or seek an in-person evaluation if the symptoms worsen or if the condition fails to improve as anticipated.  [name], [place]

## 2021-11-02 ENCOUNTER — Other Ambulatory Visit: Payer: Self-pay | Admitting: Physician Assistant

## 2021-11-02 ENCOUNTER — Other Ambulatory Visit: Payer: Self-pay | Admitting: Family Medicine

## 2021-11-10 ENCOUNTER — Other Ambulatory Visit: Payer: Self-pay | Admitting: Obstetrics & Gynecology

## 2021-11-10 ENCOUNTER — Ambulatory Visit
Admission: RE | Admit: 2021-11-10 | Discharge: 2021-11-10 | Disposition: A | Discharge status: Home / Self Care | Payer: BC Managed Care – PPO | Source: Ambulatory Visit | Attending: Obstetrics & Gynecology | Admitting: Obstetrics & Gynecology

## 2021-11-10 DIAGNOSIS — Z1231 Encounter for screening mammogram for malignant neoplasm of breast: Secondary | ICD-10-CM

## 2021-11-11 ENCOUNTER — Other Ambulatory Visit: Payer: Self-pay | Admitting: Obstetrics & Gynecology

## 2021-11-11 DIAGNOSIS — R928 Other abnormal and inconclusive findings on diagnostic imaging of breast: Secondary | ICD-10-CM

## 2021-12-03 ENCOUNTER — Other Ambulatory Visit: Payer: Self-pay | Admitting: Physician Assistant

## 2021-12-08 ENCOUNTER — Ambulatory Visit
Admission: RE | Admit: 2021-12-08 | Discharge: 2021-12-08 | Disposition: A | Discharge status: Home / Self Care | Payer: BC Managed Care – PPO | Source: Ambulatory Visit | Attending: Obstetrics & Gynecology | Admitting: Obstetrics & Gynecology

## 2021-12-08 ENCOUNTER — Other Ambulatory Visit: Payer: Self-pay | Admitting: Obstetrics & Gynecology

## 2021-12-08 ENCOUNTER — Other Ambulatory Visit: Payer: Self-pay

## 2021-12-08 DIAGNOSIS — R928 Other abnormal and inconclusive findings on diagnostic imaging of breast: Secondary | ICD-10-CM

## 2021-12-09 ENCOUNTER — Encounter: Payer: Self-pay | Admitting: Physician Assistant

## 2021-12-10 ENCOUNTER — Telehealth (INDEPENDENT_AMBULATORY_CARE_PROVIDER_SITE_OTHER): Payer: BC Managed Care – PPO | Admitting: Physician Assistant

## 2021-12-10 ENCOUNTER — Other Ambulatory Visit: Payer: Self-pay

## 2021-12-10 ENCOUNTER — Encounter: Payer: Self-pay | Admitting: Physician Assistant

## 2021-12-10 VITALS — BP 140/85 | HR 88 | Temp 97.5°F | Ht 65.0 in | Wt 181.0 lb

## 2021-12-10 DIAGNOSIS — U071 COVID-19: Secondary | ICD-10-CM

## 2021-12-10 MED ORDER — MOLNUPIRAVIR EUA 200MG CAPSULE: 4.0000 | ORAL_CAPSULE | Freq: Two times a day (BID) | ORAL | 0 refills | Status: AC

## 2021-12-10 NOTE — Progress Notes (Signed)
Virtual Visit via Video   I connected with Holly Richardson on 12/10/21 at  8:30 AM EST by a video enabled telemedicine application and verified that I am speaking with the correct person using two identifiers. Location patient: Home Location provider: [facility], Office Persons participating in the virtual visit: Holly Richardson, [place] PA-C, Holly Mull, LPN   I discussed the limitations of evaluation and management by telemedicine and the availability of in person appointments. The patient expressed understanding and agreed to proceed.  I acted as a Neurosurgeon for [employer], PA-C [employer], LPN   Subjective:   HPI:   Patient is requesting evaluation for possible COVID-19.  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  Treatments tried: Percocet this AM for headache  Patient risk factors: Current COVID-19 risk of complications score: 1 Smoking status: Holly Richardson  reports that she has never smoked. She has never used smokeless tobacco. If female, currently pregnant? []   Yes []   No  ROS: See pertinent positives and negatives per HPI.  Patient Active Problem List   Diagnosis Date Noted   Irritable bowel syndrome with diarrhea 09/30/2019   Chronic pain of right knee 07/30/2019   Bipolar 2 disorder (HCC), Rx Prozac and Lithium 01/24/2019   Eating disorder in remission, Hx of anorexia as a teen, struggles with emotional and binge eating as an adult, followed by Psych, RD; Intuitive Eating 01/24/2019   Primary osteoarthritis of both knees, Rx Celebrex 11/28/2018   Bloating 03/16/2017    Insulin resistance, diet-controlled 10/24/2016   Cardiac murmur, 2016 ECHO: LVEF 60-65%, aortic valve sclerosis, trivial TR, ASCVD 2.8% 08/03/2015   Essential hypertension, now controlled on no medication 05/31/2014   Hyperlipidemia LDL goal <130, Rx Zocor and Fenofibrate 05/31/2014   Recurrent major depressive disorder, in partial remission (HCC) 05/31/2014    Social History   Tobacco Use   Smoking status: Never   Smokeless tobacco: Never  Substance Use Topics   Alcohol use: Yes    Alcohol/week: 7.0 standard drinks    Types: 7 Standard drinks or equivalent per week    Comment: mostly less often    Current Outpatient Medications:    ALPRAZolam (XANAX) 0.5 MG tablet, Take 0.25-0.5 mg by mouth daily as needed., Disp: , Rfl:    atorvastatin (LIPITOR) 20 MG tablet, TAKE 1 TABLET BY MOUTH DAILY, Disp: 90 tablet, Rfl: 0   celecoxib (CELEBREX) 200 MG capsule, TAKE ONE (1) CAPSULE BY MOUTH 2 TIMES DAILY, Disp: 180 capsule, Rfl: 0   Cholecalciferol (VITAMIN D-1000 MAX ST) 25 MCG (1000 UT) tablet, Take 1 tablet (1,000 Units total) by mouth daily., Disp: 90 tablet, Rfl: 3   fenofibrate 160 MG tablet, TAKE ONE (1) TABLET BY MOUTH EVERY DAY, Disp: 30 tablet, Rfl: 5   FLUoxetine (PROZAC) 40 MG capsule, Take 1 capsule by mouth daily., Disp: , Rfl:    lithium carbonate 300 MG capsule, Take 300 mg by mouth 2 (two) times daily with a meal. , Disp: , Rfl:    molnupiravir EUA (LAGEVRIO) 200 mg CAPS capsule, Take 4 capsules (800 mg total) by mouth 2 (two) times daily  for 5 days., Disp: 40 capsule, Rfl: 0   valACYclovir (VALTREX) 1000 MG tablet, Take 2 tablets (2,000 mg total) by mouth 2 (two) times daily., Disp: 8 tablet, Rfl: 0  Allergies  Allergen Reactions   Codeine     Headache    Objective:   VITALS: Per patient if applicable, see vitals. GENERAL: Alert, appears well and in no acute distress. HEENT: Atraumatic, conjunctiva clear, no obvious abnormalities on inspection of external nose and  ears. NECK: Normal movements of the head and neck. CARDIOPULMONARY: No increased WOB. Speaking in clear sentences. I:E ratio WNL.  MS: Moves all visible extremities without noticeable abnormality. PSYCH: Pleasant and cooperative, well-groomed. Speech normal rate and rhythm. Affect is appropriate. Insight and judgement are appropriate. Attention is focused, linear, and appropriate.  NEURO: CN grossly intact. Oriented as arrived to appointment on time with no prompting. Moves both UE equally.  SKIN: No obvious lesions, wounds, erythema, or cyanosis noted on face or hands.  Assessment and Plan:   Holly Richardson was seen today for covid positive.  Diagnoses and all orders for this visit:  COVID-19  Other orders -     molnupiravir EUA (LAGEVRIO) 200 mg CAPS capsule; Take 4 capsules (800 mg total) by mouth 2 (two) times daily for 5 days.   No red flags on discussion, patient is not in any obvious distress during our visit.  Discussed antivirals, she is agreeable to molnupiravir.  Discussed over the counter supportive care options, including Tylenol 500 mg q 8 hours, with recommendations to push fluids and rest. Reviewed return precautions including new/worsening fever, SOB, new/worsening cough, sudden onset changes of symptoms. Recommended need to self-quarantine and practice social distancing until symptoms resolve. I recommend that patient follow-up if symptoms worsen or persist despite treatment x 7-10 days, sooner if needed.  I discussed the assessment and treatment plan with the patient. The patient was provided an opportunity to ask questions and all were answered. The patient agreed with the plan and demonstrated an understanding of the instructions.   The patient was advised to call back or seek an in-person evaluation if the symptoms worsen or if the condition fails to improve as anticipated.   CMA or LPN served as scribe during this visit. History, Physical, and Plan performed by medical  provider. The above documentation has been reviewed and is accurate and complete.   [place], [place] 12/10/2021

## 2021-12-13 ENCOUNTER — Encounter: Payer: Self-pay | Admitting: Physician Assistant

## 2021-12-14 ENCOUNTER — Encounter: Payer: Self-pay | Admitting: Physician Assistant

## 2021-12-16 ENCOUNTER — Other Ambulatory Visit: Payer: BC Managed Care – PPO

## 2021-12-21 ENCOUNTER — Ambulatory Visit
Admission: RE | Admit: 2021-12-21 | Discharge: 2021-12-21 | Disposition: A | Discharge status: Home / Self Care | Payer: BC Managed Care – PPO | Source: Ambulatory Visit | Attending: Obstetrics & Gynecology | Admitting: Obstetrics & Gynecology

## 2021-12-21 DIAGNOSIS — R928 Other abnormal and inconclusive findings on diagnostic imaging of breast: Secondary | ICD-10-CM

## 2021-12-21 HISTORY — PX: BREAST BIOPSY: SHX20

## 2022-01-03 ENCOUNTER — Other Ambulatory Visit: Payer: Self-pay

## 2022-01-03 ENCOUNTER — Ambulatory Visit (INDEPENDENT_AMBULATORY_CARE_PROVIDER_SITE_OTHER): Payer: BC Managed Care – PPO | Admitting: Physician Assistant

## 2022-01-03 ENCOUNTER — Encounter: Payer: Self-pay | Admitting: Physician Assistant

## 2022-01-03 VITALS — BP 120/86 | HR 63 | Temp 97.3°F | Ht 65.0 in | Wt 181.0 lb

## 2022-01-03 DIAGNOSIS — H9201 Otalgia, right ear: Secondary | ICD-10-CM | POA: Diagnosis not present

## 2022-01-03 MED ORDER — VITAMIN D-1000 MAX ST 25 MCG (1000 UT) PO TABS: 1000.0000 [IU] | ORAL_TABLET | Freq: Every day | ORAL | 3 refills | Status: DC

## 2022-01-03 NOTE — Progress Notes (Signed)
Holly Richardson is a 61 y.o. female here for right ear pain.  History of Present Illness:   Chief Complaint  Patient presents with   Otalgia    Pt c/o right ear pain started x several weeks. Has been using Claritin not helping.    HPI  Right Otalgia  Holly Richardson presents with c/o right ear pain that has been onset for several weeks. Pt describes pain as full sensation that has caused her not to hear as great. Holly Richardson did test positive for COVID-19 on 12/09/21 and f/u with me via tele-health on 12/10/21 due to sx. During that time she was experiencing headache, dry cough, chills, diarrhea, body aches, and nausea. Due to nature of sx and positive covid test, I prescribed her with Molnupiravir 800 mg BID x 5 days. Following this she had been compliant with the medication and found it to be beneficial through out her recovery. At this time she does not believe ear pain to be related to past COVID infection. Denies fever, chills, or dizziness.   Past Medical History:  Diagnosis Date   Anorexia 1982   Anxiety    Arthritis    Depression    Heart murmur    History of chicken pox    Hyperlipidemia    Hypertension    Kidney stones    Vaginal delivery 1992     Social History   Tobacco Use   Smoking status: Never   Smokeless tobacco: Never  Vaping Use   Vaping Use: Never used  Substance Use Topics   Alcohol use: Yes    Alcohol/week: 7.0 standard drinks    Types: 7 Standard drinks or equivalent per week    Comment: mostly less often   Drug use: No    Past Surgical History:  Procedure Laterality Date   ABDOMINAL SURGERY     APPENDECTOMY     Bladder Tack     BREAST BIOPSY Left 2015   CESAREAN SECTION  1994   TONSILLECTOMY AND ADENOIDECTOMY      Family History  Problem Relation Age of Onset   CAD Mother    Arthritis Mother    Heart disease Mother    Hypertension Mother    Hypertension Father    Heart disease Father    Cystic fibrosis Son     Allergies  Allergen Reactions    Codeine     Headache    Current Medications:   Current Outpatient Medications:    ALPRAZolam (XANAX) 0.5 MG tablet, Take 0.25-0.5 mg by mouth daily as needed., Disp: , Rfl:    atorvastatin (LIPITOR) 20 MG tablet, TAKE 1 TABLET BY MOUTH DAILY, Disp: 90 tablet, Rfl: 0   celecoxib (CELEBREX) 200 MG capsule, TAKE ONE (1) CAPSULE BY MOUTH 2 TIMES DAILY, Disp: 180 capsule, Rfl: 0   Cholecalciferol (VITAMIN D-1000 MAX ST) 25 MCG (1000 UT) tablet, Take 1 tablet (1,000 Units total) by mouth daily., Disp: 90 tablet, Rfl: 3   fenofibrate 160 MG tablet, TAKE ONE (1) TABLET BY MOUTH EVERY DAY, Disp: 30 tablet, Rfl: 5   FLUoxetine (PROZAC) 40 MG capsule, Take 1 capsule by mouth daily., Disp: , Rfl:    lithium carbonate 300 MG capsule, Take 300 mg by mouth 2 (two) times daily with a meal. , Disp: , Rfl:    valACYclovir (VALTREX) 1000 MG tablet, Take 2 tablets (2,000 mg total) by mouth 2 (two) times daily., Disp: 8 tablet, Rfl: 0   Review of Systems:   ROS Negative unless  otherwise specified per HPI. Vitals:   Vitals:   01/03/22 1549  BP: 120/86  Pulse: 63  Temp: (!) 97.3 F (36.3 C)  TempSrc: Temporal  SpO2: 96%  Weight: 181 lb (82.1 kg)  Height: 5\' 5"  (1.651 m)     Body mass index is 30.12 kg/m.  Physical Exam:   Physical Exam Vitals and nursing note reviewed.  Constitutional:      General: She is not in acute distress.    Appearance: She is well-developed. She is not ill-appearing or toxic-appearing.  HENT:     Head: Normocephalic and atraumatic.     Right Ear: Ear canal and external ear normal. There is impacted cerumen. Tympanic membrane is not erythematous, retracted or bulging.     Left Ear: Tympanic membrane, ear canal and external ear normal. Tympanic membrane is not erythematous, retracted or bulging.     Nose: Nose normal.     Right Sinus: No maxillary sinus tenderness or frontal sinus tenderness.     Left Sinus: No maxillary sinus tenderness or frontal sinus  tenderness.     Mouth/Throat:     Pharynx: Uvula midline. No posterior oropharyngeal erythema.  Eyes:     General: Lids are normal.     Conjunctiva/sclera: Conjunctivae normal.  Neck:     Trachea: Trachea normal.  Cardiovascular:     Rate and Rhythm: Normal rate and regular rhythm.     Pulses: Normal pulses.     Heart sounds: Normal heart sounds, S1 normal and S2 normal.  Pulmonary:     Effort: Pulmonary effort is normal.     Breath sounds: Normal breath sounds. No decreased breath sounds, wheezing, rhonchi or rales.  Lymphadenopathy:     Cervical: No cervical adenopathy.  Skin:    General: Skin is warm and dry.  Neurological:     Mental Status: She is alert.     GCS: GCS eye subscore is 4. GCS verbal subscore is 5. GCS motor subscore is 6.  Psychiatric:        Speech: Speech normal.        Behavior: Behavior normal. Behavior is cooperative.   Ceruminosis is noted.  Wax is removed by syringing and manual debridement.   Assessment and Plan:   Right ear pain No red flags Symptoms resolved after ear lavage No evidence of infection after procedure Continue to monitor Follow-up as needed  I,Havlyn C Ratchford,acting as a scribe for , PA.,have documented all relevant documentation on the behalf of [employer], PA,as directed by  [name], PA while in the presence of [name], [name].  I, [place], [name], have reviewed all documentation for this visit. The documentation on 01/04/22 for the exam, diagnosis, procedures, and orders are all accurate and complete.   01/06/22, PA-C

## 2022-01-14 LAB — HM PAP SMEAR: HPV, high-risk: NEGATIVE

## 2022-02-04 ENCOUNTER — Encounter: Payer: BC Managed Care – PPO | Admitting: Physician Assistant

## 2022-04-05 ENCOUNTER — Other Ambulatory Visit: Payer: Self-pay | Admitting: Family Medicine

## 2022-04-05 ENCOUNTER — Other Ambulatory Visit: Payer: Self-pay | Admitting: Physician Assistant

## 2022-04-21 ENCOUNTER — Ambulatory Visit (INDEPENDENT_AMBULATORY_CARE_PROVIDER_SITE_OTHER): Payer: BC Managed Care – PPO | Admitting: Physician Assistant

## 2022-04-21 ENCOUNTER — Encounter: Payer: Self-pay | Admitting: Physician Assistant

## 2022-04-21 VITALS — BP 118/80 | HR 68 | Temp 98.7°F | Ht 65.0 in | Wt 196.4 lb

## 2022-04-21 DIAGNOSIS — Z0000 Encounter for general adult medical examination without abnormal findings: Secondary | ICD-10-CM | POA: Diagnosis not present

## 2022-04-21 DIAGNOSIS — R635 Abnormal weight gain: Secondary | ICD-10-CM | POA: Diagnosis not present

## 2022-04-21 DIAGNOSIS — Z79899 Other long term (current) drug therapy: Secondary | ICD-10-CM | POA: Diagnosis not present

## 2022-04-21 DIAGNOSIS — F3181 Bipolar II disorder: Secondary | ICD-10-CM | POA: Diagnosis not present

## 2022-04-21 DIAGNOSIS — M170 Bilateral primary osteoarthritis of knee: Secondary | ICD-10-CM

## 2022-04-21 LAB — COMPREHENSIVE METABOLIC PANEL
ALT: 18 U/L (ref 0–35)
AST: 17 U/L (ref 0–37)
Albumin: 4.5 g/dL (ref 3.5–5.2)
Alkaline Phosphatase: 58 U/L (ref 39–117)
BUN: 15 mg/dL (ref 6–23)
CO2: 25 mEq/L (ref 19–32)
Calcium: 9.6 mg/dL (ref 8.4–10.5)
Chloride: 107 mEq/L (ref 96–112)
Creatinine, Ser: 0.7 mg/dL (ref 0.40–1.20)
GFR: 95.2 mL/min (ref >60.00)
Glucose, Bld: 99 mg/dL (ref 70–99)
Potassium: 3.9 mEq/L (ref 3.5–5.1)
Sodium: 140 mEq/L (ref 135–145)
Total Bilirubin: 0.5 mg/dL (ref 0.2–1.2)
Total Protein: 6.9 g/dL (ref 6.0–8.3)

## 2022-04-21 LAB — CBC WITH DIFFERENTIAL/PLATELET
Basophils Absolute: 0 10*3/uL (ref 0.0–0.1)
Basophils Relative: 0.3 % (ref 0.0–3.0)
Eosinophils Absolute: 0.1 10*3/uL (ref 0.0–0.7)
Eosinophils Relative: 1.1 % (ref 0.0–5.0)
HCT: 38.5 % (ref 36.0–46.0)
Hemoglobin: 13 g/dL (ref 12.0–15.0)
Lymphocytes Relative: 21.5 % (ref 12.0–46.0)
Lymphs Abs: 1.6 10*3/uL (ref 0.7–4.0)
MCHC: 33.7 g/dL (ref 30.0–36.0)
MCV: 90.1 fl (ref 78.0–100.0)
Monocytes Absolute: 0.5 10*3/uL (ref 0.1–1.0)
Monocytes Relative: 7.2 % (ref 3.0–12.0)
Neutro Abs: 5.3 10*3/uL (ref 1.4–7.7)
Neutrophils Relative %: 69.9 % (ref 43.0–77.0)
Platelets: 296 10*3/uL (ref 150.0–400.0)
RBC: 4.28 Mil/uL (ref 3.87–5.11)
RDW: 12.9 % (ref 11.5–15.5)
WBC: 7.6 10*3/uL (ref 4.0–10.5)

## 2022-04-21 LAB — LIPID PANEL
Cholesterol: 141 mg/dL (ref 0–200)
HDL: 34.2 mg/dL — ABNORMAL LOW (ref >39.00)
NonHDL: 106.45
Total CHOL/HDL Ratio: 4
Triglycerides: 290 mg/dL — ABNORMAL HIGH (ref 0.0–149.0)
VLDL: 58 mg/dL — ABNORMAL HIGH (ref 0.0–40.0)

## 2022-04-21 LAB — HEMOGLOBIN A1C: Hgb A1c MFr Bld: 5.6 % (ref 4.6–6.5)

## 2022-04-21 LAB — LDL CHOLESTEROL, DIRECT: Direct LDL: 68 mg/dL

## 2022-04-21 LAB — TSH: TSH: 3 u[IU]/mL (ref 0.35–5.50)

## 2022-04-21 NOTE — Progress Notes (Signed)
Subjective:    Holly Richardson is a 61 y.o. female and is here for a comprehensive physical exam.   HPI  There are no preventive care reminders to display for this patient.   Acute Concerns: None   Chronic Issues: Obesity   Patient here with concern for weight gain for the past 2 months. States she has gained about 20 Ibs for the past 2 months. She was on vocation few weeks ago and states she only had 1-2 drinks during this trip. She is trying to cut down on alcohol intake. States she is currently exercising everyday which include walking 4 miles a day. She is not sure what could be causing her weight gain. She has been sleeping well at night. Denies any eating disorder. Denies any other concerning sx.   Bipolar 2 disorder  Patient is currently compliant taking Prozac 40 mg daily, Litihium 300 mg BID. She is tolerating her medication well with no adverse side effects. States she was recently started back on Prozac by psychiatry. Was taking this before but this was stopped. She also takes Xanax 0.5 as needed. Has no complaints about her medications and is overall doing well. No SI/HI.   Osteoarthritis of both knees Patient is currently taking Celebrex 200 mg twice daily with no complication. Still has been dealing with knee pain but this manageable at this time.    Health Maintenance: Immunizations -- UTD Covid-UTD,02/21/2020 Colonoscopy -- UTD Mammogram -- UTD PAP -- UTD Bone Density -- N/A Diet -- Balanced  Sleep habits -- No concern  Exercise -- Walk 4 miles a day. Weight -- 196 Ib (89.1 kg) Mood -- Better  Weight history: Wt Readings from Last 10 Encounters:  04/21/22 196 lb 6.4 oz (89.1 kg)  01/03/22 181 lb (82.1 kg)  12/10/21 181 lb (82.1 kg)  10/26/21 178 lb (80.7 kg)  05/24/21 188 lb (85.3 kg)  04/19/21 193 lb (87.5 kg)  03/24/21 193 lb (87.5 kg)  12/09/20 186 lb 3.2 oz (84.5 kg)  12/04/20 190 lb (86.2 kg)  11/17/20 181 lb (82.1 kg)   Body mass index is  32.68 kg/m. No LMP recorded. Patient is postmenopausal. Alcohol use:  reports current alcohol use. Tobacco use: None Tobacco Use: Low Risk  (04/21/2022)   Patient History    Smoking Tobacco Use: Never    Smokeless Tobacco Use: Never    Passive Exposure: Not on file        04/21/2022    8:26 AM  Depression screen PHQ 2/9  Decreased Interest 0  Down, Depressed, Hopeless 0  PHQ - 2 Score 0     Other providers/specialists: Patient Care Team: [name], [place] as PCP - General (Physician Assistant) Nahser, Deloris Ping, MD as Consulting Physician (Cardiology) Bjorn Pippin, MD as Attending Physician (Urology) Hollar, Ronal Fear, MD as Referring Physician (Dermatology) Jimmey Ralph Forest Becker, DO (Osteopathic Medicine) Mitchel Honour, DO as Consulting Physician (Obstetrics and Gynecology) Ivery Quale., DPM as Consulting Physician (Podiatry) Bjorn Pippin, MD as Consulting Physician (Orthopedic Surgery) Serita Kyle, PT as Physical Therapist (Physical Therapy) Algernon Huxley, [facility] as Counselor (Psychiatry) Charlott Rakes, MD as Consulting Physician (Gastroenterology) [name], [place] as Physician Assistant (Physician Assistant)    PMHx, SurgHx, SocialHx, Medications, and Allergies were reviewed in the Visit Navigator and updated as appropriate.   Past Medical History:  Diagnosis Date   Anorexia 1982   Anxiety    Arthritis    Depression    Heart murmur  History of chicken pox    Hyperlipidemia    Hypertension    Kidney stones    Vaginal delivery 1992     Past Surgical History:  Procedure Laterality Date   ABDOMINAL SURGERY     APPENDECTOMY     Bladder Tack     BREAST BIOPSY Left 2015   CESAREAN SECTION  1994   TONSILLECTOMY AND ADENOIDECTOMY       Family History  Problem Relation Age of Onset   CAD Mother    Arthritis Mother    Heart disease Mother    Hypertension Mother    Kidney disease Mother    Hypertension Father    Heart disease  Father    Cystic fibrosis Son     Social History   Tobacco Use   Smoking status: Never   Smokeless tobacco: Never  Vaping Use   Vaping Use: Never used  Substance Use Topics   Alcohol use: Yes    Comment: mostly less often   Drug use: No    Review of Systems:   Review of Systems  Constitutional:  Negative for chills, fever, malaise/fatigue and weight loss.  HENT:  Negative for hearing loss, sinus pain and sore throat.   Respiratory:  Negative for cough and hemoptysis.   Cardiovascular:  Negative for chest pain, palpitations, leg swelling and PND.  Gastrointestinal:  Negative for abdominal pain, constipation, diarrhea, heartburn, nausea and vomiting.  Genitourinary:  Negative for dysuria, frequency and urgency.  Musculoskeletal:  Negative for back pain, myalgias and neck pain.  Skin:  Negative for itching and rash.  Neurological:  Negative for dizziness, tingling, seizures and headaches.  Endo/Heme/Allergies:  Negative for polydipsia.  Psychiatric/Behavioral:  Negative for depression. The patient is not nervous/anxious.      Objective:   BP 118/80   Pulse 68   Temp 98.7 F (37.1 C)   Ht 5\' 5"  (1.651 m)   Wt 196 lb 6.4 oz (89.1 kg) Comment: Pt does not wt  SpO2 97%   BMI 32.68 kg/m  Body mass index is 32.68 kg/m.   General Appearance:    Alert, cooperative, no distress, appears stated age  Head:    Normocephalic, without obvious abnormality, atraumatic  Eyes:    PERRL, conjunctiva/corneas clear, EOM's intact, fundi    benign, both eyes  Ears:    Normal TM's and external ear canals, both ears  Nose:   Nares normal, septum midline, mucosa normal, no drainage    or sinus tenderness  Throat:   Lips, mucosa, and tongue normal; teeth and gums normal  Neck:   Supple, symmetrical, trachea midline, no adenopathy;    thyroid:  no enlargement/tenderness/nodules; no carotid   bruit or JVD  Back:     Symmetric, no curvature, ROM normal, no CVA tenderness  Lungs:     Clear  to auscultation bilaterally, respirations unlabored  Chest Wall:    No tenderness or deformity   Heart:    Regular rate and rhythm, S1 and S2 normal, no murmur, rub or gallop  Breast Exam:    Deferred  Abdomen:     Soft, non-tender, bowel sounds active all four quadrants,    no masses, no organomegaly  Genitalia:    Deferred  Extremities:   Extremities normal, atraumatic, no cyanosis or edema  Pulses:   2+ and symmetric all extremities  Skin:   Skin color, texture, turgor normal, no rashes or lesions  Lymph nodes:   Cervical, supraclavicular, and axillary nodes normal  Neurologic:   CNII-XII intact, normal strength, sensation and reflexes    throughout    Assessment/Plan:   Routine physical examination Today patient counseled on age appropriate routine health concerns for screening and prevention, each reviewed and up to date or declined. Immunizations reviewed and up to date or declined. Labs ordered and reviewed. Risk factors for depression reviewed and negative. Hearing function and visual acuity are intact. ADLs screened and addressed as needed. Functional ability and level of safety reviewed and appropriate. Education, counseling and referrals performed based on assessed risks today. Patient provided with a copy of personalized plan for preventive services.   Weight gain Update blood work to r/o organic cause of symptoms Discussed that if patient needs any intervention with abnormal blood work that we will address this Recommend reaching out to Helane Rima at [employer] risk medication use Update lithium level and fax to Deatra Robinson at [phone]   Bipolar 2 disorder [facility]), Rx Prozac and Lithium Stable per patient Mgmt per psych   OA of both knees Stable, well controlled with celebrex 200 mg BID   Patient Counseling: [x]    Nutrition: Stressed importance of moderation in sodium/caffeine intake, saturated fat and cholesterol, caloric balance, sufficient  intake of fresh fruits, vegetables, fiber, calcium, iron, and 1 mg of folate supplement per day (for females capable of pregnancy).  [x]    Stressed the importance of regular exercise.   [x]    Substance Abuse: Discussed cessation/primary prevention of tobacco, alcohol, or other drug use; driving or other dangerous activities under the influence; availability of treatment for abuse.   [x]    Injury prevention: Discussed safety belts, safety helmets, smoke detector, smoking near bedding or upholstery.   [x]    Sexuality: Discussed sexually transmitted diseases, partner selection, use of condoms, avoidance of unintended pregnancy  and contraceptive alternatives.  [x]    Dental health: Discussed importance of regular tooth brushing, flossing, and dental visits.  [x]    Health maintenance and immunizations reviewed. Please refer to Health maintenance section.    I,Savera Zaman,acting as a for , PA.,have documented all relevant documentation on the behalf of , PA,as directed by  , PA while in the presence of , .   I, , Neurosurgeon, have reviewed all documentation for this visit. The documentation on 04/21/22 for the exam, diagnosis, procedures, and orders are all accurate and complete.   [name], PA-C [facility]

## 2022-04-21 NOTE — Patient Instructions (Addendum)
It was great to see you!  Google "[employer]" and look up [place] -- call and get on the list if you are interested  Listen to "Maintenance Phase" podcast -- I think you will enjoy it!  Please go to the lab for blood work.   Our office will call you with your results unless you have chosen to receive results via MyChart.  If your blood work is normal we will follow-up each year for physicals and as scheduled for chronic medical problems.  If anything is abnormal we will treat accordingly and get you in for a follow-up.  Take care,  Lelon Mast

## 2022-04-22 LAB — LITHIUM LEVEL: Lithium Lvl: 1 mmol/L (ref 0.6–1.2)

## 2022-04-25 ENCOUNTER — Encounter: Payer: Self-pay | Admitting: Physician Assistant

## 2022-06-06 IMAGING — US US  BREAST BX W/ LOC DEV 1ST LESION IMG BX SPEC US GUIDE*R*
1 series · 11 of 11 positions shown · non-contrast
Comparison: Previous exam(s).
COMPARISON: Previous exam(s).

Addendum:
CLINICAL DATA: 60-year-old female presenting for ultrasound-guided
biopsy of a right breast mass.

EXAM:
ULTRASOUND GUIDED RIGHT BREAST CORE NEEDLE BIOPSY

[Series 1: us breast bx w/ loc dev 1st lesion img bx spec us  · 0.04mm/px · 11 of 11 slices shown]
[im 1/11]
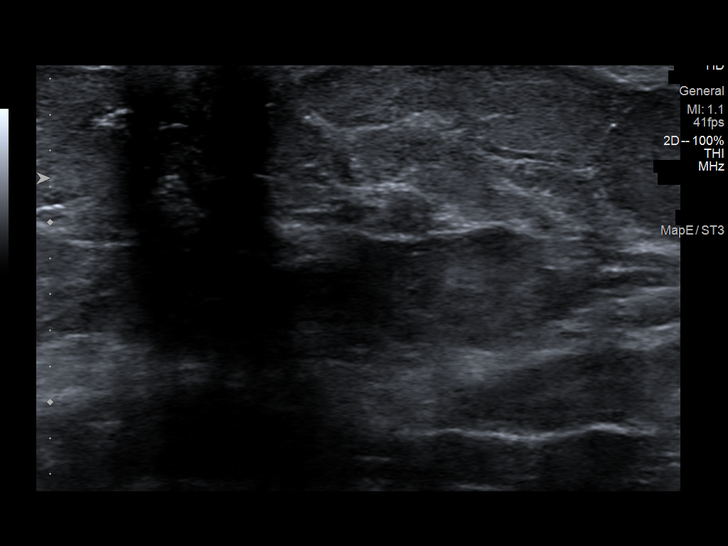
[im 2/11]
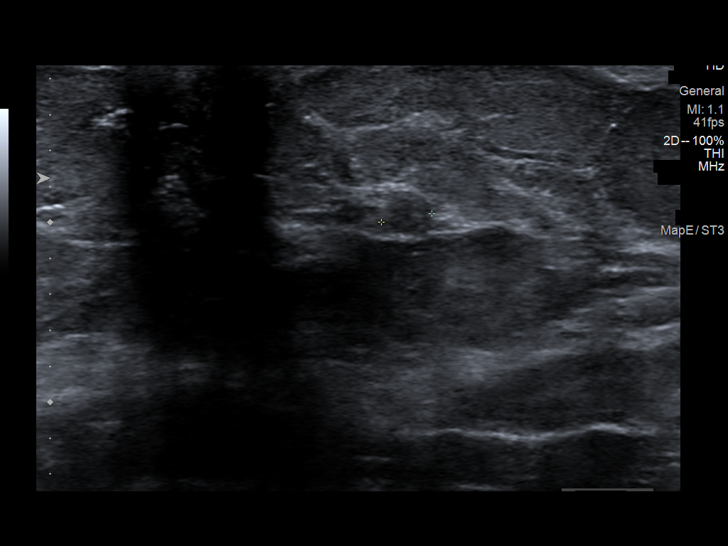
[im 3/11]
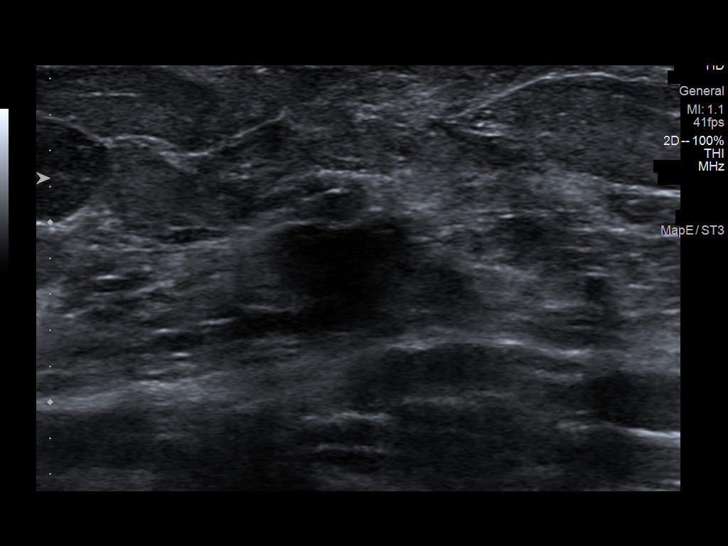
[im 4/11]
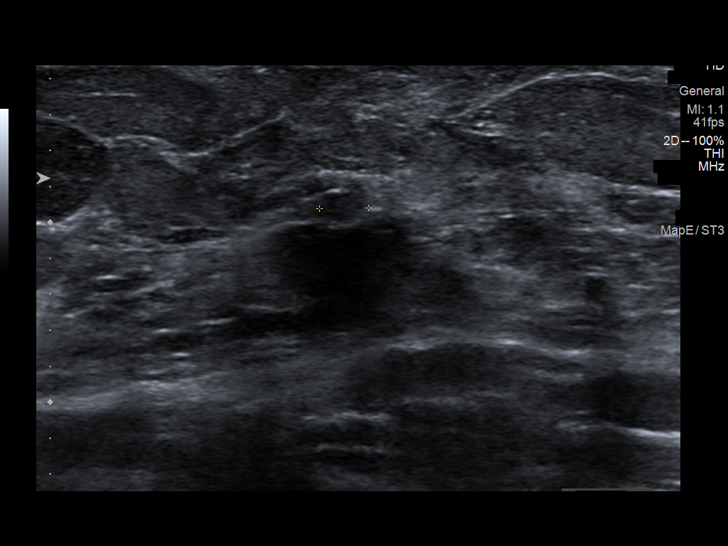
[im 5/11]
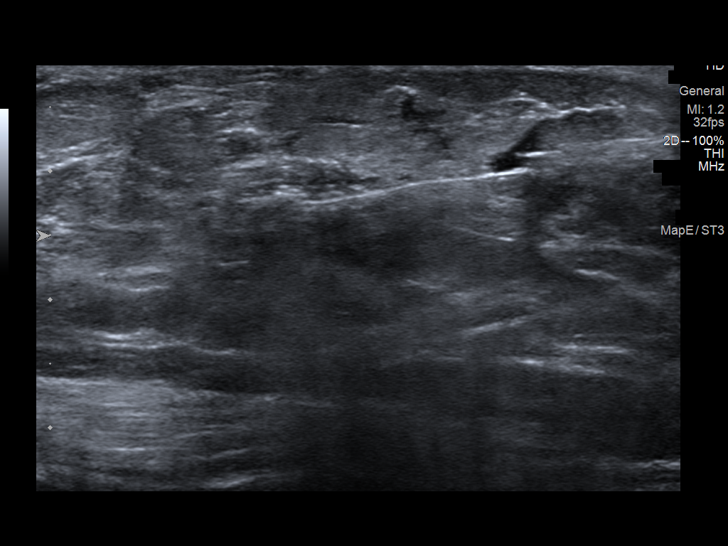
[im 6/11]
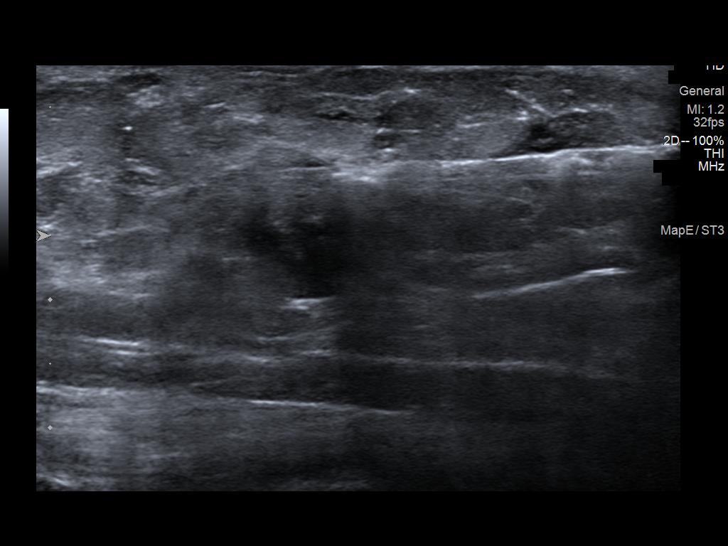
[im 7/11]
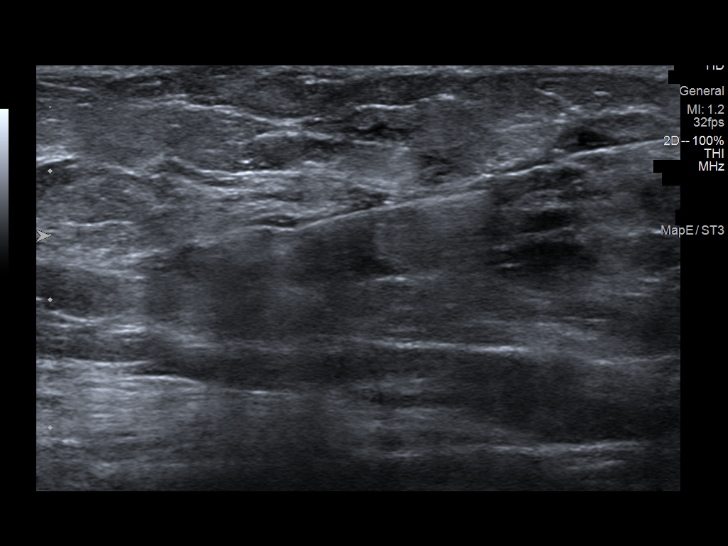
[im 8/11]
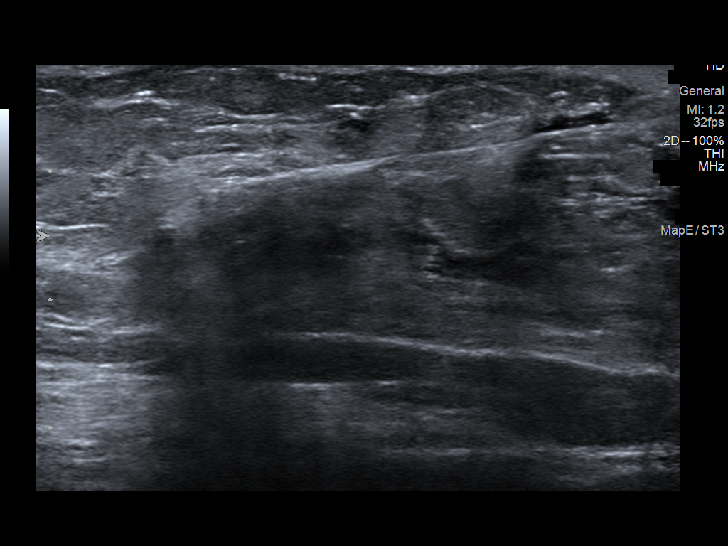
[im 9/11]
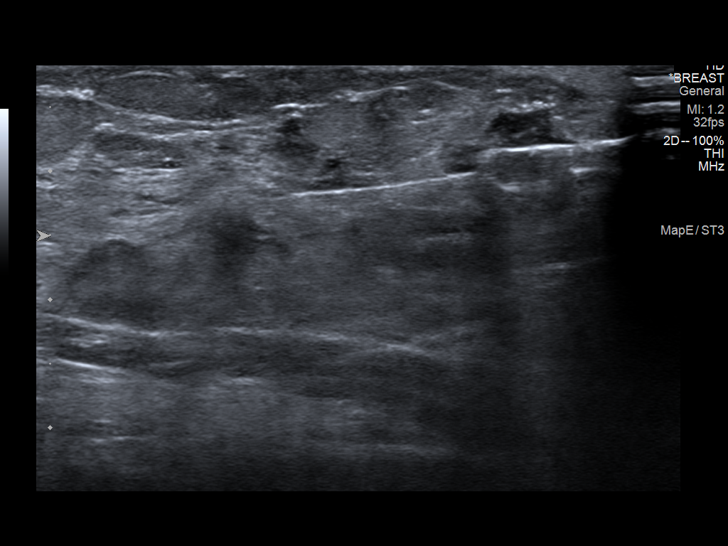
[im 10/11]
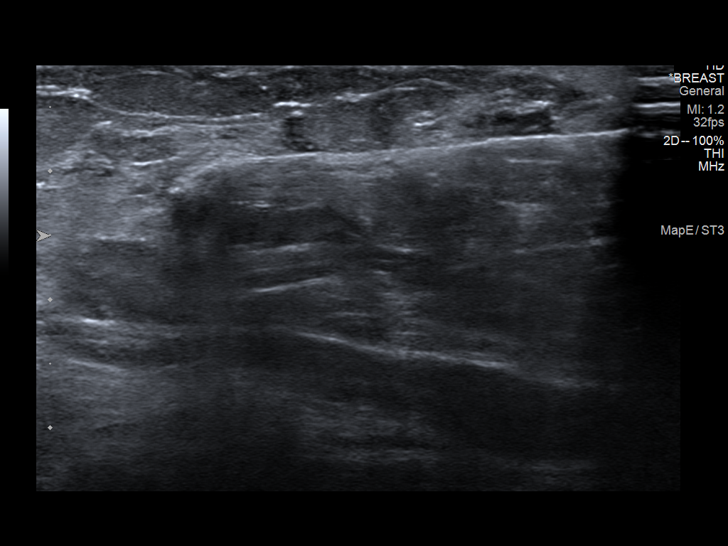
[im 11/11]
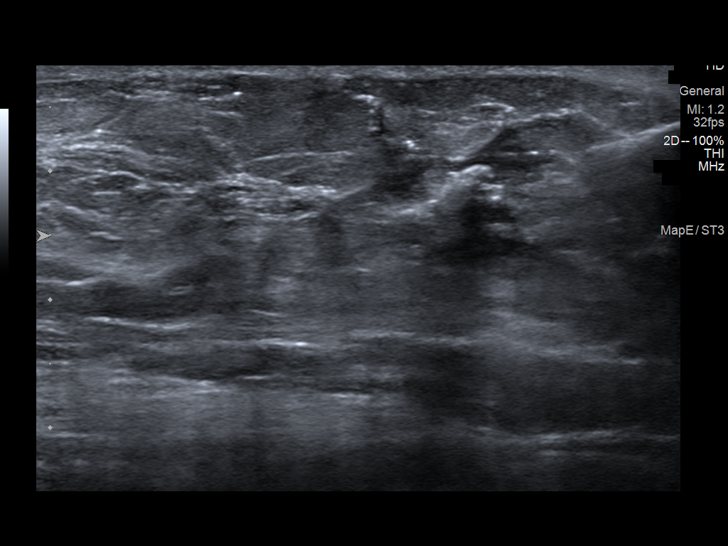

[11 of 11 positions shown; findings below may reference images not displayed]



Lesion quadrant: Lower outer quadrant

Using sterile technique and 1% Lidocaine as local anesthetic, under
direct ultrasound visualization, a 14 gauge Normayanti device was
used to perform biopsy of a mass in the right breast at 7 o'clock, 1
cm from the nipple using an inferolateral approach. At the
conclusion of the procedure a coil shaped tissue marker clip was
deployed into the biopsy cavity. Follow up 2 view mammogram was
performed and dictated separately.
IMPRESSION: Ultrasound guided biopsy of a right breast mass at 7 o'clock. No
apparent complications.

ADDENDUM:
Pathology revealed COLUMNAR CELL AND FIBROCYSTIC CHANGES WITH
STROMAL FIBROSIS of the RIGHT breast, 7:00 o'clock, 1 cmfn, (coil
clip). This was found to be concordant by Dr. Yanlei Lastimoso.

Pathology results were discussed with the patient by telephone. The
patient reported doing well after the biopsy with tenderness at the
site. Post biopsy instructions and care were reviewed and questions
were answered. The patient was encouraged to call The [REDACTED] for any additional concerns. My direct phone
number was provided.

The patient was asked to return for RIGHT diagnostic mammography and
ultrasound in 6 months and informed a reminder notice would be sent
regarding this appointment.

Pathology results reported by Imanitta Hussa, RN on 12/22/2021.



Lesion quadrant: Lower outer quadrant

Using sterile technique and 1% Lidocaine as local anesthetic, under
direct ultrasound visualization, a 14 gauge Normayanti device was
used to perform biopsy of a mass in the right breast at 7 o'clock, 1
cm from the nipple using an inferolateral approach. At the
conclusion of the procedure a coil shaped tissue marker clip was
deployed into the biopsy cavity. Follow up 2 view mammogram was
performed and dictated separately.
IMPRESSION: Ultrasound guided biopsy of a right breast mass at 7 o'clock. No
apparent complications.

## 2022-06-06 IMAGING — MG MM BREAST LOCALIZATION CLIP
4 series · 4 of 12 positions shown · non-contrast
Comparison: Previous exam(s).

CLINICAL DATA: Post biopsy mammogram of the right breast for clip
placement.

EXAM:
3D DIAGNOSTIC RIGHT MAMMOGRAM POST ULTRASOUND BIOPSY

[R ML synth-2D]
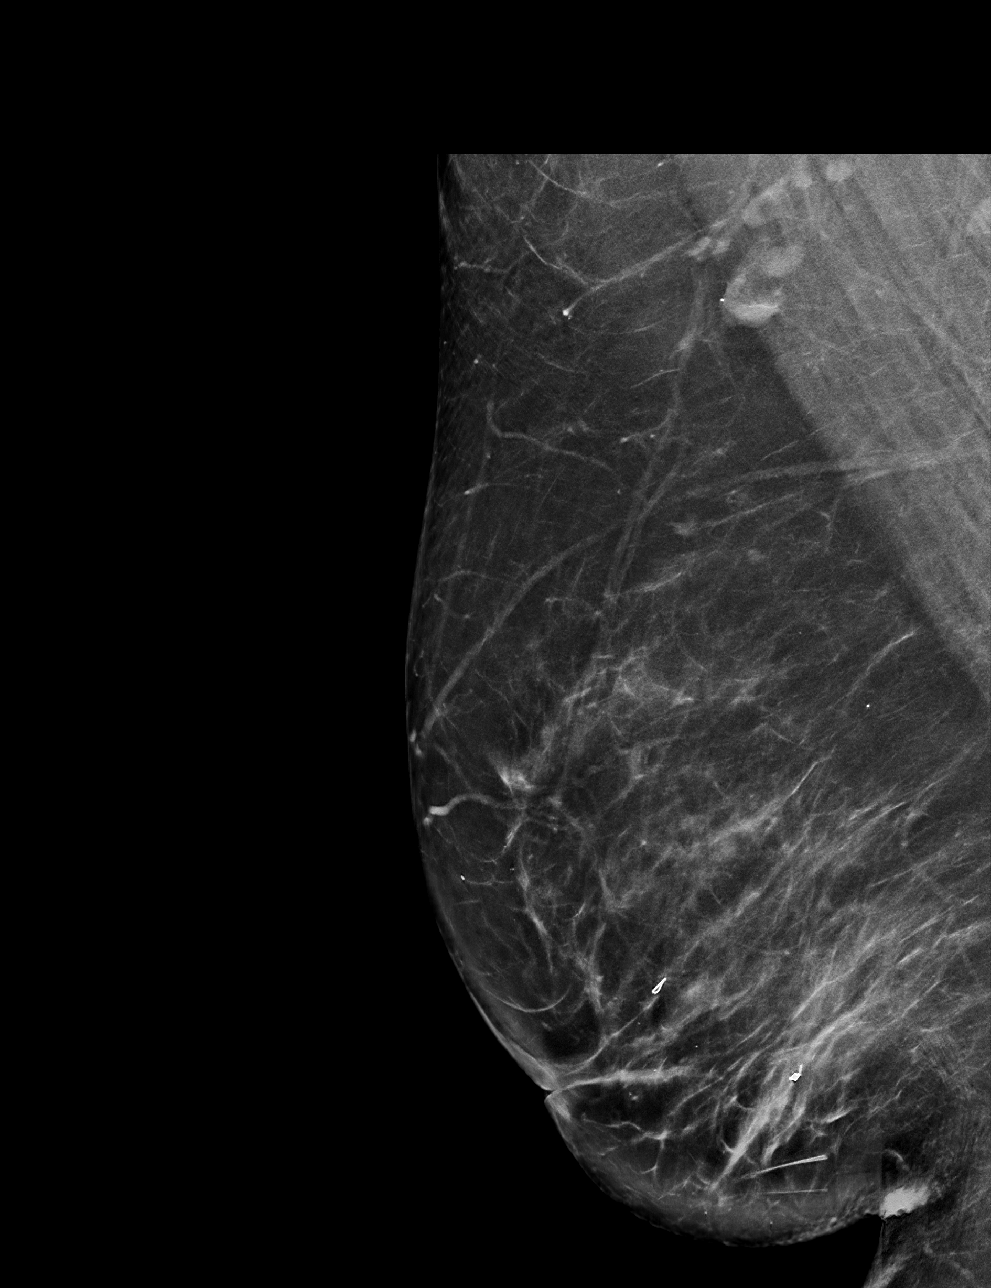

[R CC synth-2D]
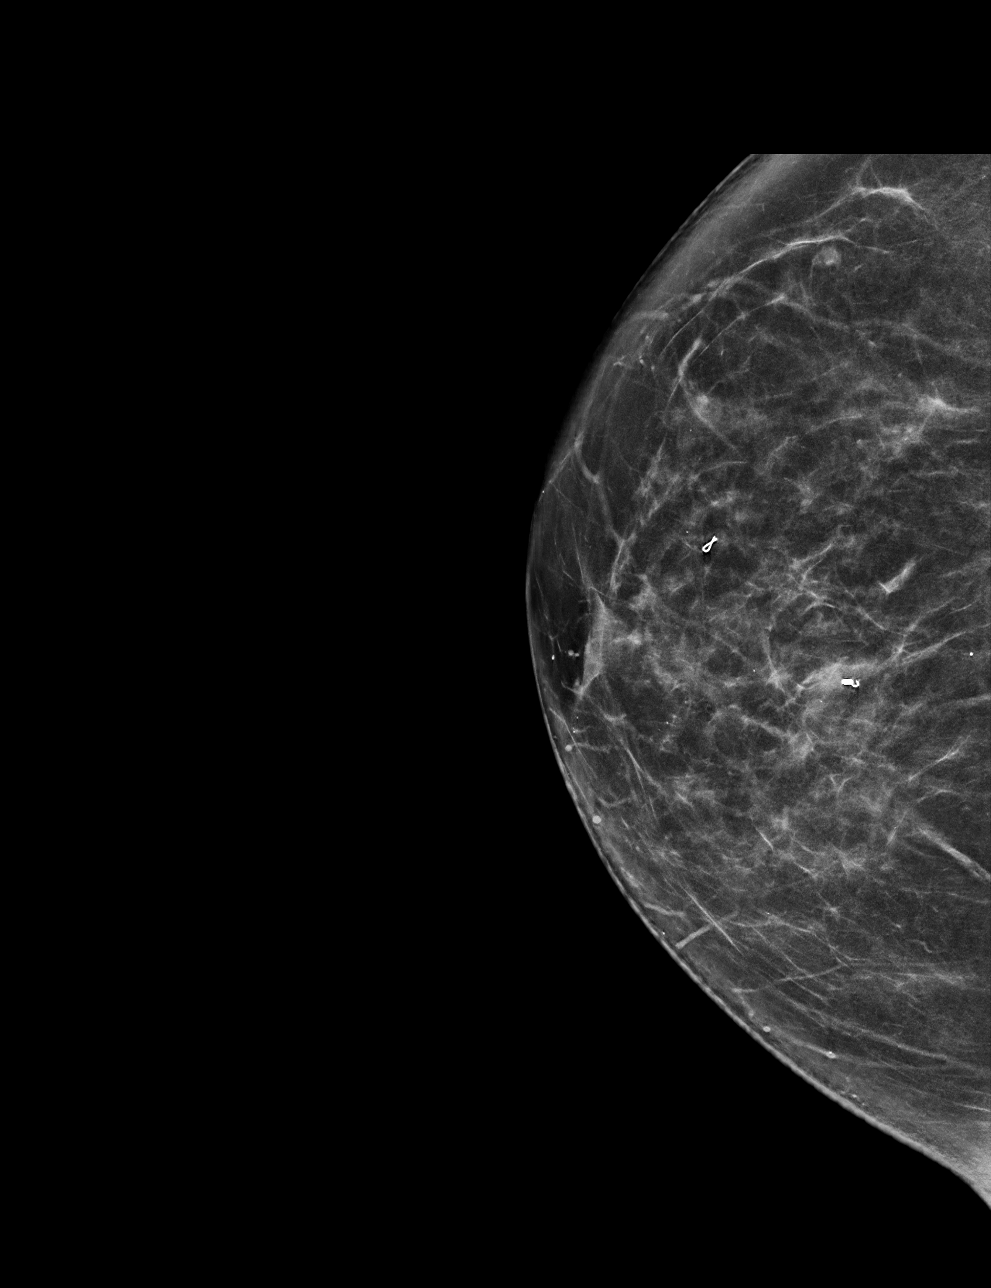

[R ML tomo · tomo slice 41/80.0]
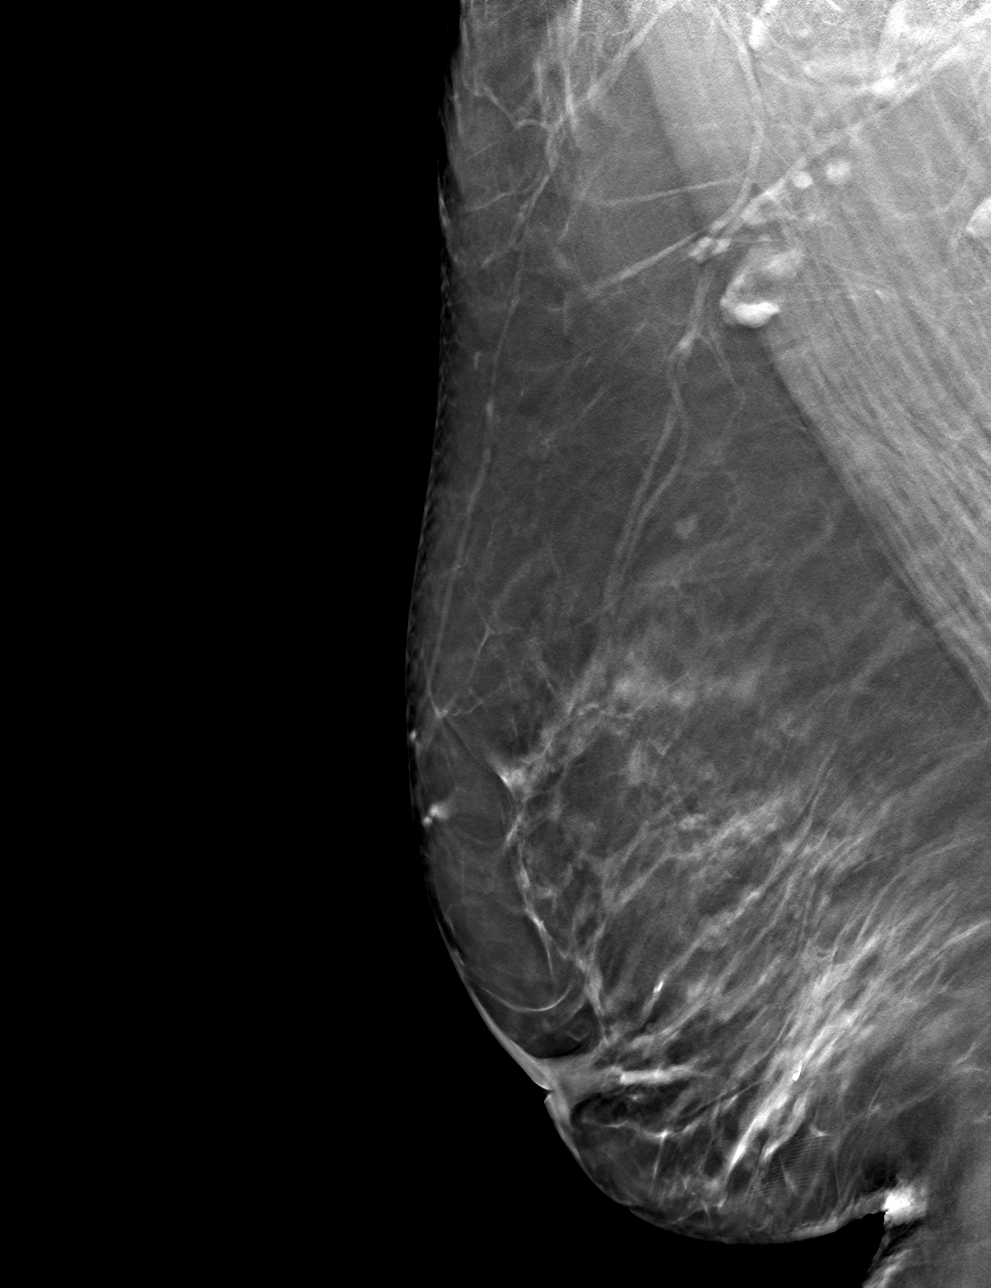

[R CC tomo · tomo slice 35/70.0]
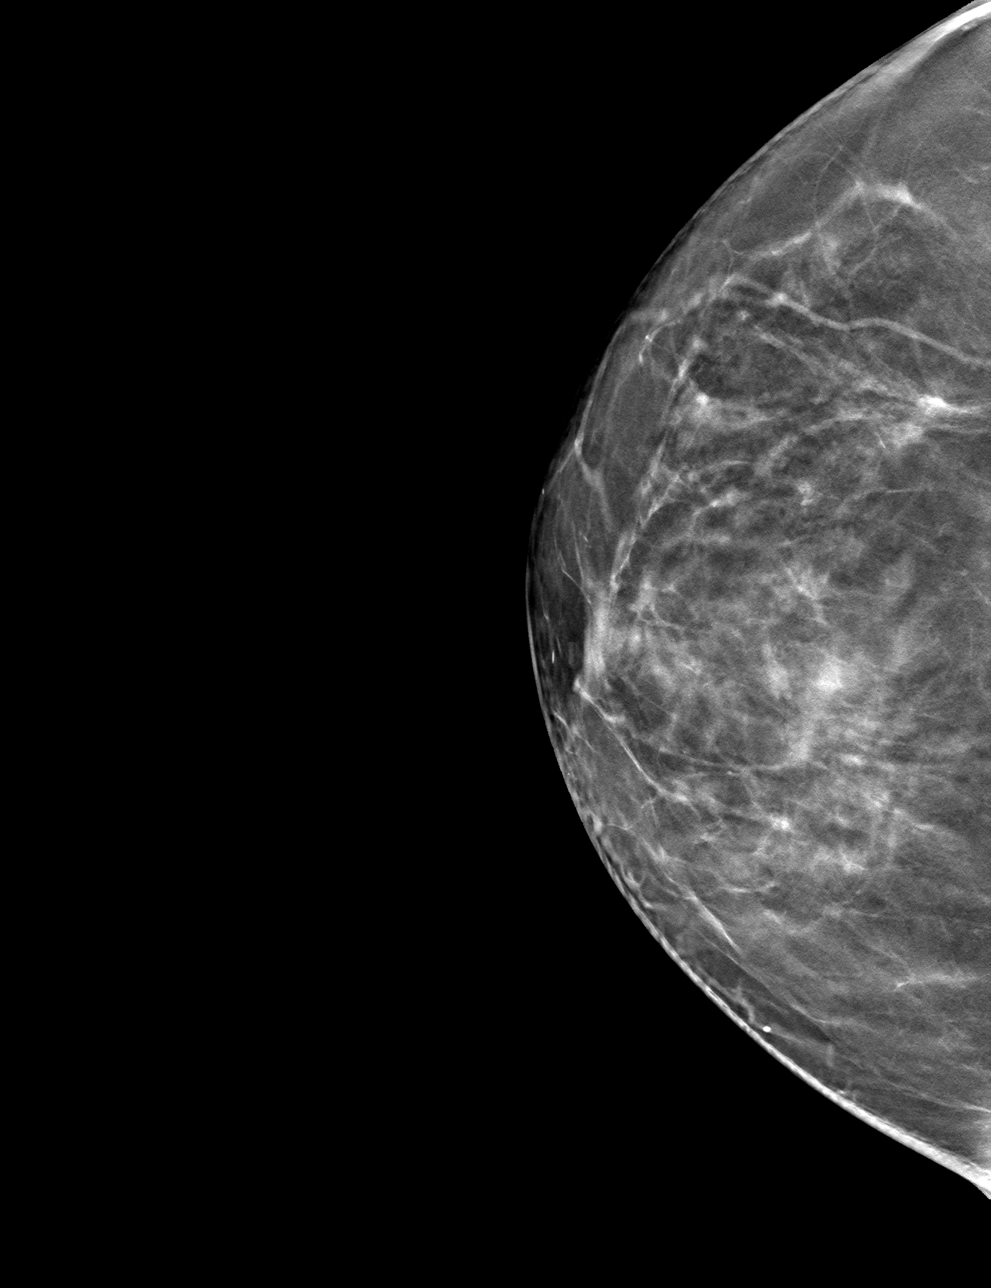

[4 of 12 positions shown; findings below may reference images not displayed]

FINDINGS: 3D Mammographic images were obtained following ultrasound guided
biopsy of a right breast mass at 7 o'clock, 1 cm from the nipple.
The biopsy marking clip is in expected position at the site of
biopsy.
IMPRESSION: Appropriate positioning of the coil shaped biopsy marking clip at
the site of biopsy in the slightly lower outer right breast.

Final Assessment: Post Procedure Mammograms for Marker Placement

## 2022-07-22 ENCOUNTER — Encounter: Payer: Self-pay | Admitting: Physician Assistant

## 2022-07-22 ENCOUNTER — Other Ambulatory Visit: Payer: Self-pay | Admitting: Physician Assistant

## 2022-07-22 MED ORDER — FENOFIBRATE 160 MG PO TABS: ORAL_TABLET | ORAL | 1 refills | Status: DC

## 2022-07-22 NOTE — Telephone Encounter (Signed)
See message, I sent Fenofibrate 160 mg tablet, 90 day supply to the pharmacy.

## 2022-08-05 ENCOUNTER — Telehealth (INDEPENDENT_AMBULATORY_CARE_PROVIDER_SITE_OTHER): Payer: BC Managed Care – PPO | Admitting: Physician Assistant

## 2022-08-05 ENCOUNTER — Telehealth: Payer: BC Managed Care – PPO | Admitting: Physician Assistant

## 2022-08-05 ENCOUNTER — Encounter: Payer: Self-pay | Admitting: Physician Assistant

## 2022-08-05 VITALS — BP 149/84 | HR 72 | Ht 65.0 in | Wt 188.0 lb

## 2022-08-05 DIAGNOSIS — B009 Herpesviral infection, unspecified: Secondary | ICD-10-CM | POA: Diagnosis not present

## 2022-08-05 DIAGNOSIS — R6889 Other general symptoms and signs: Secondary | ICD-10-CM | POA: Diagnosis not present

## 2022-08-05 MED ORDER — AZITHROMYCIN 250 MG PO TABS: ORAL_TABLET | ORAL | 0 refills | Status: AC

## 2022-08-05 MED ORDER — LIDOCAINE VISCOUS HCL 2 % MT SOLN: 5.0000 mL | Freq: Four times a day (QID) | OROMUCOSAL | 1 refills | Status: DC | PRN

## 2022-08-05 MED ORDER — VALACYCLOVIR HCL 1 G PO TABS: ORAL_TABLET | ORAL | 0 refills | Status: DC

## 2022-08-05 NOTE — Progress Notes (Signed)
I acted as a Education administrator for [employer], PA-C Anselmo Pickler, LPN  Virtual Visit via Video Note   I, Holly Coke, PA, connected with  Holly Richardson  ([id number], 11-20-1960) on 08/05/22 at  3:20 PM EDT by a video-enabled telemedicine application and verified that I am speaking with the correct person using two identifiers.  Location: Patient: Home Provider: [place] office   I discussed the limitations of evaluation and management by telemedicine and the availability of in person appointments. The patient expressed understanding and agreed to proceed.    History of Present Illness: Holly Richardson is a 61 y.o. who identifies as a female who was assigned female at birth, and is being seen today for cough.   Pt c/o cough x 1 week, expectorating green/yellow sputum, nasal drainage green/yellow, diarrhea since Tuesday, ear pressure, and headache off an on. Chills but has not taken temp. Also has mouth sores and fever blister -- has not taken valtrex, has ran out. She has been taking Tylenol. 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  Has been home since Tuesday.  Denies inability to keep fluids down, blood in stool, n/v, recent travel, neck stiffness.  Problems:  Patient Active Problem List   Diagnosis Date Noted   Irritable bowel syndrome with diarrhea 09/30/2019   Chronic pain of right knee 07/30/2019   Bipolar 2 disorder ([place]), Rx Prozac and Lithium 01/24/2019   Eating disorder in remission, Hx of anorexia as a teen, struggles with emotional and binge eating as an adult, followed by Psych, RD; Intuitive Eating 01/24/2019   Primary osteoarthritis of both knees, Rx Celebrex 11/28/2018   Bloating 03/16/2017   Insulin resistance, diet-controlled 10/24/2016   Cardiac murmur, 2016 ECHO: LVEF 60-65%, aortic valve sclerosis, trivial TR, ASCVD 2.8% 08/03/2015   Essential hypertension, now controlled on no medication 05/31/2014    Hyperlipidemia LDL goal <130, Rx Zocor and Fenofibrate 05/31/2014   Recurrent major depressive disorder, in partial remission ([place]) 05/31/2014    Allergies:  Allergies  Allergen Reactions   Codeine     Headache   Medications:  Current Outpatient Medications:    ALPRAZolam (XANAX) 0.5 MG tablet, Take 0.25-0.5 mg by mouth daily as needed., Disp: , Rfl:    atorvastatin (LIPITOR) 20 MG tablet, TAKE 1 TABLET BY MOUTH DAILY, Disp: 90 tablet, Rfl: 0   azithromycin (ZITHROMAX) 250 MG tablet, Take 2 tablets on day 1, then 1 tablet daily on days 2 through 5, Disp: 6 tablet, Rfl: 0   Cholecalciferol (VITAMIN D-1000 MAX ST) 25 MCG (1000 UT) tablet, Take 1 tablet (1,000 Units total) by mouth daily., Disp: 90 tablet, Rfl: 3   estradiol (ESTRACE) 0.1 MG/GM vaginal cream, Insert 0.5 g per vagina nightly x 2 weeks; then 2-3x/week, Disp: , Rfl:    fenofibrate 160 MG tablet, TAKE ONE (1) TABLET BY MOUTH EVERY DAY, Disp: 90 tablet, Rfl: 1   magic mouthwash (lidocaine, diphenhydrAMINE, alum & mag hydroxide) suspension, Swish and spit 5 mLs 4 (four) times daily as needed for mouth pain., Disp: 360 mL, Rfl: 1   meloxicam (MOBIC) 15 MG tablet, Take by mouth., Disp: , Rfl:    valACYclovir (VALTREX) 1000 MG tablet, Take 2 grams twice a day for 1 day for flare., Disp: 30 tablet, Rfl: 0  Observations/Objective: Patient is well-developed, well-nourished in no acute distress.  Resting comfortably  at home.  Head is normocephalic, atraumatic.  No labored breathing.  Speech is  clear and coherent with logical content.  Patient is alert and oriented at baseline.   Assessment and Plan: 1. 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 The patient was provided an opportunity to ask questions and all were answered. The patient agreed with the plan and demonstrated an understanding of the instructions.  A copy of instructions were sent to the patient via MyChart unless otherwise noted below.   The patient was advised to call back or seek an in-person evaluation if the symptoms worsen or if the condition fails to improve as anticipated.  Holly Richardson, [place]

## 2022-08-17 ENCOUNTER — Other Ambulatory Visit: Payer: Self-pay | Admitting: Physician Assistant

## 2022-10-18 ENCOUNTER — Encounter: Payer: Self-pay | Admitting: Physician Assistant

## 2022-10-18 ENCOUNTER — Telehealth (INDEPENDENT_AMBULATORY_CARE_PROVIDER_SITE_OTHER): Payer: BC Managed Care – PPO | Admitting: Physician Assistant

## 2022-10-18 VITALS — Temp 97.8°F | Ht 65.0 in | Wt 181.0 lb

## 2022-10-18 DIAGNOSIS — R051 Acute cough: Secondary | ICD-10-CM

## 2022-10-18 MED ORDER — AZITHROMYCIN 250 MG PO TABS: ORAL_TABLET | ORAL | 0 refills | Status: AC

## 2022-10-18 NOTE — Progress Notes (Signed)
I acted as a Education administrator for [employer], PA-C Anselmo Pickler, LPN  Virtual Visit via Video Note   I Holly Coke, PA , connected with  Holly Richardson  ([id number], Apr 23, 1961) on 10/18/22 at  1:00 PM EST by a video-enabled telemedicine application and verified that I am speaking with the correct person using two identifiers.  Location: Patient: Home Provider: [place] office   I discussed the limitations of evaluation and management by telemedicine and the availability of in person appointments. The patient expressed understanding and agreed to proceed.    History of Present Illness: Holly Richardson is a 61 y.o. who identifies as a female who was assigned female at birth, and is being seen today for cough, Pt c/o cough started on Friday, coughing and expectorating green sputum,head congestion, nasal congestion yellow drainage, headache, chills, but no fever. Left ear pressure. Has been Tylenol. She did two COVID tests Sun and today both Negative.  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  Denies chest pain or shortness of breath.  Problems:  Patient Active Problem List   Diagnosis Date Noted   Irritable bowel syndrome with diarrhea 09/30/2019   Chronic pain of right knee 07/30/2019   Bipolar 2 disorder ([place]), Rx Prozac and Lithium 01/24/2019   Eating disorder in remission, Hx of anorexia as a teen, struggles with emotional and binge eating as an adult, followed by Psych, RD; Intuitive Eating 01/24/2019   Primary osteoarthritis of both knees, Rx Celebrex 11/28/2018   Bloating 03/16/2017   Insulin resistance, diet-controlled 10/24/2016   Cardiac murmur, 2016 ECHO: LVEF 60-65%, aortic valve sclerosis, trivial TR, ASCVD 2.8% 08/03/2015   Essential hypertension, now controlled on no medication 05/31/2014   Hyperlipidemia LDL goal <130, Rx Zocor and Fenofibrate 05/31/2014   Recurrent major depressive disorder, in partial remission ([place])  05/31/2014    Allergies:  Allergies  Allergen Reactions   Codeine     Headache   Medications:  Current Outpatient Medications:    ALPRAZolam (XANAX) 0.5 MG tablet, Take 0.25-0.5 mg by mouth daily as needed., Disp: , Rfl:    atorvastatin (LIPITOR) 20 MG tablet, TAKE 1 TABLET BY MOUTH DAILY, Disp: 90 tablet, Rfl: 0   azithromycin (ZITHROMAX) 250 MG tablet, Take 2 tablets on day 1, then 1 tablet daily on days 2 through 5, Disp: 6 tablet, Rfl: 0   Cholecalciferol (VITAMIN D-1000 MAX ST) 25 MCG (1000 UT) tablet, Take 1 tablet (1,000 Units total) by mouth daily., Disp: 90 tablet, Rfl: 3   estradiol (ESTRACE) 0.1 MG/GM vaginal cream, Insert 0.5 g per vagina nightly x 2 weeks; then 2-3x/week, Disp: , Rfl:    fenofibrate 160 MG tablet, TAKE ONE (1) TABLET BY MOUTH EVERY DAY, Disp: 90 tablet, Rfl: 1   magic mouthwash (lidocaine, diphenhydrAMINE, alum & mag hydroxide) suspension, Swish and spit 5 mLs 4 (four) times daily as needed for mouth pain., Disp: 360 mL, Rfl: 1   meloxicam (MOBIC) 15 MG tablet, Take by mouth., Disp: , Rfl:    valACYclovir (VALTREX) 1000 MG tablet, Take 2 grams twice a day for 1 day for flare., Disp: 30 tablet, Rfl: 0   lurasidone (LATUDA) 20 MG TABS tablet, SMARTSIG:1 Tablet(s) By Mouth Every Evening (Patient not taking: Reported on 10/18/2022), Disp: , Rfl:   Observations/Objective: Patient is well-developed, well-nourished in no acute distress.  Resting comfortably at home.  Head is normocephalic, atraumatic.  No labored breathing.  Speech is clear and  coherent with logical content.  Patient is alert and oriented at baseline.   Assessment and Plan: 1. Acute cough No red flags on discussion, patient is not in any obvious distress during our visit. Discussed progression of most viral illness, and recommended supportive care at this point in time. I did however provide pocket rx for oral azithromycin should symptoms not improve as anticipated. Discussed over the counter  supportive care options, with recommendations to push fluids and rest. Reviewed return precautions including new/worsening fever, SOB, new/worsening cough or other concerns.  Recommended need to self-quarantine and practice social distancing until symptoms resolve. Discussed current recommendations for COVID testing. I recommend that patient follow-up if symptoms worsen or persist despite treatment x 7-10 days, sooner if needed.   Follow Up Instructions: I discussed the assessment and treatment plan with the patient. The patient was provided an opportunity to ask questions and all were answered. The patient agreed with the plan and demonstrated an understanding of the instructions.  A copy of instructions were sent to the patient via MyChart unless otherwise noted below.   The patient was advised to call back or seek an in-person evaluation if the symptoms worsen or if the condition fails to improve as anticipated.  [name], [place]

## 2022-10-19 ENCOUNTER — Telehealth: Payer: BC Managed Care – PPO | Admitting: Physician Assistant

## 2022-11-15 ENCOUNTER — Ambulatory Visit: Payer: BC Managed Care – PPO | Admitting: Physician Assistant

## 2022-11-15 ENCOUNTER — Encounter: Payer: Self-pay | Admitting: Physician Assistant

## 2022-11-15 VITALS — BP 140/96 | HR 93 | Temp 97.5°F | Ht 65.0 in | Wt 187.4 lb

## 2022-11-15 DIAGNOSIS — R051 Acute cough: Secondary | ICD-10-CM | POA: Diagnosis not present

## 2022-11-15 DIAGNOSIS — F3181 Bipolar II disorder: Secondary | ICD-10-CM | POA: Diagnosis not present

## 2022-11-15 MED ORDER — ALBUTEROL SULFATE HFA 108 (90 BASE) MCG/ACT IN AERS: 2.0000 | INHALATION_SPRAY | Freq: Four times a day (QID) | RESPIRATORY_TRACT | 0 refills | Status: DC | PRN

## 2022-11-15 MED ORDER — PREDNISONE 20 MG PO TABS: 40.0000 mg | ORAL_TABLET | Freq: Every day | ORAL | 0 refills | Status: DC

## 2022-11-15 NOTE — Progress Notes (Signed)
Holly Richardson is a 62 y.o. female here for a new problem.  History of Present Illness:   Chief Complaint  Patient presents with   Cough    Pt still c/o lingering cough, coughing spells, occasional green sputum. Chills, but no fever.    HPI  Cough Patient was seen virtually by me on 10/18/22 for cough with green sputum. She was started on Azithromycin at that time. She got a little bit better after the antibiotic and was able to return to work. However, her symptoms have returned and wax and wane. She reports that her cough has been productive with green sputum with accompanying chills and ear pressure. She reports that during coughing fits she feels short of breath temporarily. Her cough is triggered with prolonged speech. She denies any sore throat or fevers. She is not using any OTC medications or nasal sprays.  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 Her husband has RSV.  Denies: CP, severe SOB, LE swelling, fever  Bipolar disorder She states that her mood has been great. She has been compliant with Latuda 20mg  daily. She denies any SI or HI. Patient is still going to therapy regularly.   Past Medical History:  Diagnosis Date   Anorexia 1982   Anxiety    Arthritis    Depression    Heart murmur    History of chicken pox    Hyperlipidemia    Hypertension    Kidney stones    Vaginal delivery 1992     Social History   Tobacco Use   Smoking status: Never   Smokeless tobacco: Never  Vaping Use   Vaping Use: Never used  Substance Use Topics   Alcohol use: Yes    Comment: mostly less often   Drug use: No    Past Surgical History:  Procedure Laterality Date   ABDOMINAL SURGERY     APPENDECTOMY     Bladder Tack     BREAST BIOPSY Left 2015   CESAREAN SECTION  1994   TONSILLECTOMY AND ADENOIDECTOMY      Family History  Problem Relation Age of  Onset   CAD Mother    Arthritis Mother    Heart disease Mother    Hypertension Mother    Kidney disease Mother    Hypertension Father    Heart disease Father    Cystic fibrosis Son     Allergies  Allergen Reactions   Codeine     Headache    Current Medications:   Current Outpatient Medications:    albuterol (VENTOLIN HFA) 108 (90 Base) MCG/ACT inhaler, Inhale 2 puffs into the lungs every 6 (six) hours as needed for wheezing or shortness of breath., Disp: 8 g, Rfl: 0   ALPRAZolam (XANAX) 0.5 MG tablet, Take 0.25-0.5 mg by mouth daily as needed., Disp: , Rfl:    atorvastatin (LIPITOR) 20 MG tablet, TAKE 1 TABLET BY MOUTH DAILY, Disp: 90 tablet, Rfl: 0   Cholecalciferol (VITAMIN D-1000 MAX ST) 25 MCG (1000 UT) tablet, Take 1 tablet (1,000 Units total) by mouth daily., Disp: 90 tablet, Rfl: 3   estradiol (ESTRACE) 0.1 MG/GM vaginal cream, Insert 0.5 g per vagina nightly x 2 weeks; then 2-3x/week, Disp: , Rfl:    fenofibrate 160 MG tablet, TAKE ONE (1) TABLET BY MOUTH EVERY DAY, Disp: 90 tablet, Rfl: 1   lurasidone (LATUDA) 20 MG TABS tablet, ,  Disp: , Rfl:    meloxicam (MOBIC) 15 MG tablet, Take by mouth., Disp: , Rfl:    predniSONE (DELTASONE) 20 MG tablet, Take 2 tablets (40 mg total) by mouth daily., Disp: 10 tablet, Rfl: 0   valACYclovir (VALTREX) 1000 MG tablet, Take 2 grams twice a day for 1 day for flare., Disp: 30 tablet, Rfl: 0   Review of Systems:   Review of Systems  Constitutional:  Positive for chills. Negative for fever.  HENT:  Negative for congestion, sinus pain and sore throat.        + bilateral ear pressure  Respiratory:  Positive for cough, sputum production and shortness of breath (only with coughing fits). Negative for wheezing.   Cardiovascular:  Negative for chest pain and palpitations.  Gastrointestinal:  Negative for blood in stool, diarrhea, nausea and vomiting.  Genitourinary:  Negative for dysuria, frequency and hematuria.  Musculoskeletal:  Negative  for joint pain and myalgias.  Psychiatric/Behavioral:  Negative for depression and suicidal ideas. The patient is not nervous/anxious.     Vitals:   Vitals:   11/15/22 1401 11/15/22 1445  BP: (!) 150/100 (!) 140/96  Pulse: 93   Temp: (!) 97.5 F (36.4 C)   TempSrc: Temporal   SpO2: 98%   Weight: 187 lb 6.1 oz (85 kg)   Height: 5\' 5"  (1.651 m)      Body mass index is 31.18 kg/m.  Physical Exam:   Physical Exam Vitals and nursing note reviewed.  Constitutional:      General: She is not in acute distress.    Appearance: She is well-developed. She is not ill-appearing or toxic-appearing.  HENT:     Head: Normocephalic and atraumatic.     Right Ear: Tympanic membrane, ear canal and external ear normal. Tympanic membrane is not erythematous, retracted or bulging.     Left Ear: Tympanic membrane, ear canal and external ear normal. Tympanic membrane is not erythematous, retracted or bulging.     Nose: Nose normal.     Right Sinus: No maxillary sinus tenderness or frontal sinus tenderness.     Left Sinus: No maxillary sinus tenderness or frontal sinus tenderness.     Mouth/Throat:     Pharynx: Uvula midline. No posterior oropharyngeal erythema.  Eyes:     General: Lids are normal.     Conjunctiva/sclera: Conjunctivae normal.  Neck:     Trachea: Trachea normal.  Cardiovascular:     Rate and Rhythm: Normal rate and regular rhythm.     Pulses: Normal pulses.     Heart sounds: Normal heart sounds, S1 normal and S2 normal.  Pulmonary:     Effort: Pulmonary effort is normal.     Breath sounds: Normal breath sounds. No decreased breath sounds, wheezing, rhonchi or rales.  Lymphadenopathy:     Cervical: No cervical adenopathy.  Skin:    General: Skin is warm and dry.  Neurological:     Mental Status: She is alert.     GCS: GCS eye subscore is 4. GCS verbal subscore is 5. GCS motor subscore is 6.  Psychiatric:        Speech: Speech normal.        Behavior: Behavior normal.  Behavior is cooperative.     Assessment and Plan:   Acute cough No red flags Suspect post-viral cough Do not anticipate any need for abx at this time Will trial oral prednisone and albuterol inhaler prn Consider OTC delsym If no improvement, will obtain xray  Bipolar  2 disorder ([place]), Rx Prozac and Lithium Well controlled per patient Discussed possible increase in mania with prednisone -- she is aware of risk and would like to proceed  I,Alexis Herring,acting as a scribe for [employer], PA.,have documented all relevant documentation on the behalf of [name], PA,as directed by  [name], PA while in the presence of [name], [place].  I, [name], [place], have reviewed all documentation for this visit. The documentation on 11/15/22 for the exam, diagnosis, procedures, and orders are all accurate and complete.   [name], PA-C

## 2022-11-15 NOTE — Patient Instructions (Addendum)
It was great to see you!  Start prednisone  Use albuterol as needed  Trial 12-hour delsym over the counter cough syrup (generic is fine!)   Take care,  [name] PA-C

## 2022-11-21 ENCOUNTER — Telehealth (INDEPENDENT_AMBULATORY_CARE_PROVIDER_SITE_OTHER): Payer: BC Managed Care – PPO | Admitting: Physician Assistant

## 2022-11-21 ENCOUNTER — Encounter: Payer: Self-pay | Admitting: Physician Assistant

## 2022-11-21 DIAGNOSIS — R052 Subacute cough: Secondary | ICD-10-CM | POA: Diagnosis not present

## 2022-11-21 NOTE — Progress Notes (Signed)
Virtual Visit via Video Note   I, [name], connected with  Holly Richardson  ([id number], 12-Dec-1960) on 11/21/22 at 11:20 AM EST by a video-enabled telemedicine application and verified that I am speaking with the correct person using two identifiers.  Location: Patient: Home Provider: [place] office   I discussed the limitations of evaluation and management by telemedicine and the availability of in person appointments. The patient expressed understanding and agreed to proceed.    History of Present Illness: Holly Richardson is a 62 y.o. who identifies as a female who was assigned female at birth, and is being seen today for ongoing cough.  She saw me on 11/15/22 and started prednisone. She felt like the dose caused irritability, so she is only taking 20 mg daily. She continues on this. She was able to go to work for a few days but then on 11/18/22 started feeling significantly worse. Negative COVID test on 11/19/22.  Coughing up lumps of stuff. Drinking plenty of water. No back pain, severe cough that makes her think that she has PNA.  She was also prescribed an albuterol inhaler and this has helped.  She took 500 mg clarithromycin on Friday that she had not taken for prior H Pylori infection. She has been taking twice daily and feels like this is helping.  Has a blister in the back of throat that feels like it is sometimes getting mucus caught on it.  Problems:  Patient Active Problem List   Diagnosis Date Noted   Irritable bowel syndrome with diarrhea 09/30/2019   Chronic pain of right knee 07/30/2019   Bipolar 2 disorder ([place]), Rx Prozac and Lithium 01/24/2019   Eating disorder in remission, Hx of anorexia as a teen, struggles with emotional and binge eating as an adult, followed by Psych, RD; Intuitive Eating 01/24/2019   Primary osteoarthritis of both knees, Rx Celebrex 11/28/2018   Bloating 03/16/2017   Insulin resistance, diet-controlled 10/24/2016    Cardiac murmur, 2016 ECHO: LVEF 60-65%, aortic valve sclerosis, trivial TR, ASCVD 2.8% 08/03/2015   Essential hypertension, now controlled on no medication 05/31/2014   Hyperlipidemia LDL goal <130, Rx Zocor and Fenofibrate 05/31/2014   Recurrent major depressive disorder, in partial remission ([place]) 05/31/2014    Allergies:  Allergies  Allergen Reactions   Codeine     Headache   Medications:  Current Outpatient Medications:    albuterol (VENTOLIN HFA) 108 (90 Base) MCG/ACT inhaler, Inhale 2 puffs into the lungs every 6 (six) hours as needed for wheezing or shortness of breath., Disp: 8 g, Rfl: 0   ALPRAZolam (XANAX) 0.5 MG tablet, Take 0.25-0.5 mg by mouth daily as needed., Disp: , Rfl:    atorvastatin (LIPITOR) 20 MG tablet, TAKE 1 TABLET BY MOUTH DAILY, Disp: 90 tablet, Rfl: 0   Cholecalciferol (VITAMIN D-1000 MAX ST) 25 MCG (1000 UT) tablet, Take 1 tablet (1,000 Units total) by mouth daily., Disp: 90 tablet, Rfl: 3   estradiol (ESTRACE) 0.1 MG/GM vaginal cream, Insert 0.5 g per vagina nightly x 2 weeks; then 2-3x/week, Disp: , Rfl:    fenofibrate 160 MG tablet, TAKE ONE (1) TABLET BY MOUTH EVERY DAY, Disp: 90 tablet, Rfl: 1   lurasidone (LATUDA) 20 MG TABS tablet, , Disp: , Rfl:    meloxicam (MOBIC) 15 MG tablet, Take by mouth., Disp: , Rfl:    predniSONE (DELTASONE) 20 MG tablet, Take 2 tablets (40 mg total) by mouth daily., Disp: 10 tablet, Rfl: 0  valACYclovir (VALTREX) 1000 MG tablet, Take 2 grams twice a day for 1 day for flare., Disp: 30 tablet, Rfl: 0  Observations/Objective: Patient is well-developed, well-nourished in no acute distress.  Resting comfortably  at home.  Head is normocephalic, atraumatic.  No labored breathing.  Speech is clear and coherent with logical content.  Patient is alert and oriented at baseline.   Below is picture of spot on the back of her throat she submitted via mychart     Assessment and Plan: 1. Subacute cough Suspect ongoing URI She  started herself on oral abx of clarithromycin 500 mg BID x 5 days, which she states that she has enough of to get through 5 days -- this will cover for sinusitis -- recommend completing this course Continue prednisone 20 mg dailiy and prn albuterol Follow-up if any new/worsening sx Offered chest xray but she declined  Follow Up Instructions: I discussed the assessment and treatment plan with the patient. The patient was provided an opportunity to ask questions and all were answered. The patient agreed with the plan and demonstrated an understanding of the instructions.  A copy of instructions were sent to the patient via MyChart unless otherwise noted below.   The patient was advised to call back or seek an in-person evaluation if the symptoms worsen or if the condition fails to improve as anticipated.  [name], [place]

## 2023-01-23 ENCOUNTER — Encounter: Payer: Self-pay | Admitting: Family Medicine

## 2023-01-24 ENCOUNTER — Other Ambulatory Visit: Payer: Self-pay | Admitting: Obstetrics & Gynecology

## 2023-01-24 DIAGNOSIS — N6489 Other specified disorders of breast: Secondary | ICD-10-CM

## 2023-02-02 ENCOUNTER — Telehealth: Payer: BC Managed Care – PPO | Admitting: Physician Assistant

## 2023-02-02 ENCOUNTER — Encounter: Payer: Self-pay | Admitting: Physician Assistant

## 2023-02-02 VITALS — BP 144/80 | HR 78 | Ht 65.0 in | Wt 194.0 lb

## 2023-02-02 DIAGNOSIS — R051 Acute cough: Secondary | ICD-10-CM

## 2023-02-02 MED ORDER — PREDNISONE 20 MG PO TABS: 40.0000 mg | ORAL_TABLET | Freq: Every day | ORAL | 0 refills | Status: DC

## 2023-02-02 MED ORDER — AZITHROMYCIN 250 MG PO TABS: ORAL_TABLET | ORAL | 0 refills | Status: AC

## 2023-02-02 NOTE — Progress Notes (Signed)
I acted as a Education administrator for [employer], PA-C Anselmo Pickler, LPN  Virtual Visit via Video Note   I, [name], PA, connected with  Holly Richardson  ([id number], 07-15-1961) on 02/02/23 at  8:20 AM EDT by a video-enabled telemedicine application and verified that I am speaking with the correct person using two identifiers.  Location: Patient: Home Provider: [place] office   I discussed the limitations of evaluation and management by telemedicine and the availability of in person appointments. The patient expressed understanding and agreed to proceed.    History of Present Illness: Holly Richardson is a 62 y.o. who identifies as a female who was assigned female at birth, and is being seen today for sinus problem. Pt c/o sinus issues since 3/15.   Pt c/o cough, post nasal drainage, coughing and expectorating green sputum, also having green nasal drainage, having headaches. No fever since 3/20 was low grade 100.8 for several days. Did Covid test 3/17 was Neg. Has been using extra strength Tylenol.   Problems:  Patient Active Problem List   Diagnosis Date Noted   Irritable bowel syndrome with diarrhea 09/30/2019   Chronic pain of right knee 07/30/2019   Bipolar 2 disorder ([place]), Rx Prozac and Lithium 01/24/2019   Eating disorder in remission, Hx of anorexia as a teen, struggles with emotional and binge eating as an adult, followed by Psych, RD; Intuitive Eating 01/24/2019   Primary osteoarthritis of both knees, Rx Celebrex 11/28/2018   Bloating 03/16/2017   Insulin resistance, diet-controlled 10/24/2016   Cardiac murmur, 2016 ECHO: LVEF 60-65%, aortic valve sclerosis, trivial TR, ASCVD 2.8% 08/03/2015   Essential hypertension, now controlled on no medication 05/31/2014   Hyperlipidemia LDL goal <130, Rx Zocor and Fenofibrate 05/31/2014   Recurrent major depressive disorder, in partial remission ([place]) 05/31/2014    Allergies:  Allergies  Allergen Reactions    Codeine     Headache   Medications:  Current Outpatient Medications:    albuterol (VENTOLIN HFA) 108 (90 Base) MCG/ACT inhaler, Inhale 2 puffs into the lungs every 6 (six) hours as needed for wheezing or shortness of breath., Disp: 8 g, Rfl: 0   ALPRAZolam (XANAX) 0.5 MG tablet, Take 0.25-0.5 mg by mouth daily as needed., Disp: , Rfl:    atorvastatin (LIPITOR) 20 MG tablet, TAKE 1 TABLET BY MOUTH DAILY, Disp: 90 tablet, Rfl: 0   azithromycin (ZITHROMAX) 250 MG tablet, Take 2 tablets on day 1, then 1 tablet daily on days 2 through 5, Disp: 6 tablet, Rfl: 0   Cholecalciferol (VITAMIN D-1000 MAX ST) 25 MCG (1000 UT) tablet, Take 1 tablet (1,000 Units total) by mouth daily., Disp: 90 tablet, Rfl: 3   estradiol (ESTRACE) 0.1 MG/GM vaginal cream, Insert 0.5 g per vagina nightly x 2 weeks; then 2-3x/week, Disp: , Rfl:    fenofibrate 160 MG tablet, TAKE ONE (1) TABLET BY MOUTH EVERY DAY, Disp: 90 tablet, Rfl: 1   lurasidone (LATUDA) 20 MG TABS tablet, , Disp: , Rfl:    meloxicam (MOBIC) 15 MG tablet, Take by mouth., Disp: , Rfl:    predniSONE (DELTASONE) 20 MG tablet, Take 2 tablets (40 mg total) by mouth daily., Disp: 10 tablet, Rfl: 0   valACYclovir (VALTREX) 1000 MG tablet, Take 2 grams twice a day for 1 day for flare., Disp: 30 tablet, Rfl: 0  Observations/Objective: Patient is well-developed, well-nourished in no acute distress.  Resting comfortably at home.  Head is normocephalic, atraumatic.  No labored  breathing.  Speech is clear and coherent with logical content.  Patient is alert and oriented at baseline.   Assessment and Plan: 1. Acute cough No red flags on exam.  Will initiate Azithromycin and Prednisone per orders. Discussed taking medications as prescribed. Reviewed return precautions including worsening fever, SOB, worsening cough or other concerns. Push fluids and rest. I recommend that patient follow-up if symptoms worsen or persist despite treatment x 7-10 days, sooner if  needed.  Follow Up Instructions: I discussed the assessment and treatment plan with the patient. The patient was provided an opportunity to ask questions and all were answered. The patient agreed with the plan and demonstrated an understanding of the instructions.  A copy of instructions were sent to the patient via MyChart unless otherwise noted below.   The patient was advised to call back or seek an in-person evaluation if the symptoms worsen or if the condition fails to improve as anticipated.  [name], [place]

## 2023-02-13 ENCOUNTER — Encounter: Payer: Self-pay | Admitting: Physician Assistant

## 2023-02-13 ENCOUNTER — Other Ambulatory Visit: Payer: Self-pay | Admitting: Physician Assistant

## 2023-02-13 MED ORDER — POLYMYXIN B-TRIMETHOPRIM 10000-0.1 UNIT/ML-% OP SOLN: 1.0000 [drp] | OPHTHALMIC | 0 refills | Status: DC

## 2023-02-16 ENCOUNTER — Other Ambulatory Visit: Payer: Self-pay | Admitting: Physician Assistant

## 2023-03-08 HISTORY — PX: KNEE ARTHROSCOPY: SUR90

## 2023-03-15 ENCOUNTER — Ambulatory Visit
Admission: RE | Admit: 2023-03-15 | Discharge: 2023-03-15 | Disposition: A | Discharge status: Home / Self Care | Payer: BC Managed Care – PPO | Source: Ambulatory Visit | Attending: Obstetrics & Gynecology | Admitting: Obstetrics & Gynecology

## 2023-03-15 ENCOUNTER — Ambulatory Visit: Admission: RE | Admit: 2023-03-15 | Payer: BC Managed Care – PPO | Source: Ambulatory Visit

## 2023-03-15 DIAGNOSIS — N6489 Other specified disorders of breast: Secondary | ICD-10-CM

## 2023-06-07 ENCOUNTER — Other Ambulatory Visit: Payer: Self-pay | Admitting: Physician Assistant

## 2023-06-07 ENCOUNTER — Encounter: Payer: Self-pay | Admitting: Physician Assistant

## 2023-06-08 ENCOUNTER — Telehealth: Payer: BC Managed Care – PPO | Admitting: Physician Assistant

## 2023-06-08 ENCOUNTER — Encounter: Payer: Self-pay | Admitting: Physician Assistant

## 2023-06-08 DIAGNOSIS — U071 COVID-19: Secondary | ICD-10-CM

## 2023-06-08 NOTE — Progress Notes (Signed)
Virtual Visit via Video   I connected with Holly Richardson on 06/08/23 at  8:20 AM EDT by a video enabled telemedicine application and verified that I am speaking with the correct person using two identifiers. Location patient: Home Location provider: [facility], Office Persons participating in the virtual visit: [place], [place] PA-C  I discussed the limitations of evaluation and management by telemedicine and the availability of in person appointments. The patient expressed understanding and agreed to proceed.  Subjective:   HPI:   COVID-19 Positive Symptom onset: Wednesday (8 days ago) Travel/contacts: recent travel to [facility]  Vaccination status: UpToDate Testing results: yesterday was positive at home  Patient endorses the following symptoms: headache(s), dry cough, aches, nasal congestion  Patient denies the following symptoms: chest pain, SOB  Treatments tried: Tylenol with antihistamine, fluids  ROS: See pertinent positives and negatives per HPI.  Patient Active Problem List   Diagnosis Date Noted   Irritable bowel syndrome with diarrhea 09/30/2019   Chronic pain of right knee 07/30/2019   Bipolar 2 disorder ([facility]), Rx Prozac and Lithium 01/24/2019   Eating disorder in remission, Hx of anorexia as a teen, struggles with emotional and binge eating as an adult, followed by Psych, RD; Intuitive Eating 01/24/2019   Primary osteoarthritis of both knees, Rx Celebrex 11/28/2018   Bloating 03/16/2017   Insulin resistance, diet-controlled 10/24/2016   Cardiac murmur, 2016 ECHO: LVEF 60-65%, aortic valve sclerosis, trivial TR, ASCVD 2.8% 08/03/2015   Essential hypertension, now controlled on no medication 05/31/2014   Hyperlipidemia LDL goal <130, Rx Zocor and Fenofibrate 05/31/2014   Recurrent major depressive disorder, in partial remission ([facility]) 05/31/2014    Social History   Tobacco Use   Smoking status: Never   Smokeless tobacco: Never  Substance Use  Topics   Alcohol use: Yes    Comment: mostly less often    Current Outpatient Medications:    albuterol (VENTOLIN HFA) 108 (90 Base) MCG/ACT inhaler, Inhale 2 puffs into the lungs every 6 (six) hours as needed for wheezing or shortness of breath., Disp: 8 g, Rfl: 0   ALPRAZolam (XANAX) 0.5 MG tablet, Take 0.25-0.5 mg by mouth daily as needed., Disp: , Rfl:    atorvastatin (LIPITOR) 20 MG tablet, TAKE 1 TABLET BY MOUTH DAILY, Disp: 90 tablet, Rfl: 0   Cholecalciferol (VITAMIN D-1000 MAX ST) 25 MCG (1000 UT) tablet, TAKE ONE CAPSULE BY MOUTH DAILY, Disp: 90 tablet, Rfl: 0   estradiol (ESTRACE) 0.1 MG/GM vaginal cream, Insert 0.5 g per vagina nightly x 2 weeks; then 2-3x/week, Disp: , Rfl:    fenofibrate 160 MG tablet, TAKE ONE (1) TABLET BY MOUTH EVERY DAY, Disp: 90 tablet, Rfl: 1   lurasidone (LATUDA) 20 MG TABS tablet, , Disp: , Rfl:    meloxicam (MOBIC) 15 MG tablet, Take by mouth., Disp: , Rfl:    predniSONE (DELTASONE) 20 MG tablet, Take 2 tablets (40 mg total) by mouth daily., Disp: 10 tablet, Rfl: 0   trimethoprim-polymyxin b (POLYTRIM) ophthalmic solution, Place 1 drop into both eyes every 4 (four) hours., Disp: 10 mL, Rfl: 0   valACYclovir (VALTREX) 1000 MG tablet, Take 2 grams twice a day for 1 day for flare., Disp: 30 tablet, Rfl: 0  Allergies  Allergen Reactions   Codeine     Headache    Objective:   VITALS: Per patient if applicable, see vitals. GENERAL: Alert, appears well and in no acute distress. HEENT: Atraumatic, conjunctiva clear, no obvious abnormalities on  inspection of external nose and ears. NECK: Normal movements of the head and neck. CARDIOPULMONARY: No increased WOB. Speaking in clear sentences. I:E ratio WNL.  MS: Moves all visible extremities without noticeable abnormality. PSYCH: Pleasant and cooperative, well-groomed. Speech normal rate and rhythm. Affect is appropriate. Insight and judgement are appropriate. Attention is focused, linear, and appropriate.   NEURO: CN grossly intact. Oriented as arrived to appointment on time with no prompting. Moves both UE equally.  SKIN: No obvious lesions, wounds, erythema, or cyanosis noted on face or hands.  Assessment and Plan:   Diagnoses and all orders for this visit:  COVID-19   No red flags on discussion, patient is not in any obvious distress during our visit. Discussed progression of most viral illnesses, and recommended supportive care at this point in time.   Discussed over the counter supportive care options, including Tylenol 500 mg q 8 hours, with recommendations to push fluids and rest. Reviewed return precautions including new/worsening fever, SOB, new/worsening cough, sudden onset changes of symptoms. Recommended need to self-quarantine and practice social distancing until symptoms resolve. I recommend that patient follow-up if symptoms worsen or persist despite treatment x 7-10 days, sooner if needed.  I discussed the assessment and treatment plan with the patient. The patient was provided an opportunity to ask questions and all were answered. The patient agreed with the plan and demonstrated an understanding of the instructions.   The patient was advised to call back or seek an in-person evaluation if the symptoms worsen or if the condition fails to improve as anticipated.   [place], [place] 06/08/2023

## 2023-06-14 ENCOUNTER — Other Ambulatory Visit: Payer: Self-pay | Admitting: Physician Assistant

## 2023-06-16 ENCOUNTER — Other Ambulatory Visit: Payer: Self-pay | Admitting: Physician Assistant

## 2023-06-20 ENCOUNTER — Encounter: Payer: Self-pay | Admitting: Physician Assistant

## 2023-06-20 ENCOUNTER — Ambulatory Visit: Payer: BC Managed Care – PPO | Admitting: Physician Assistant

## 2023-06-20 VITALS — BP 140/90 | HR 74 | Temp 97.3°F | Ht 64.25 in | Wt 196.0 lb

## 2023-06-20 DIAGNOSIS — I10 Essential (primary) hypertension: Secondary | ICD-10-CM

## 2023-06-20 DIAGNOSIS — E669 Obesity, unspecified: Secondary | ICD-10-CM

## 2023-06-20 DIAGNOSIS — E785 Hyperlipidemia, unspecified: Secondary | ICD-10-CM

## 2023-06-20 DIAGNOSIS — Z0000 Encounter for general adult medical examination without abnormal findings: Secondary | ICD-10-CM

## 2023-06-20 DIAGNOSIS — E88819 Insulin resistance, unspecified: Secondary | ICD-10-CM

## 2023-06-20 DIAGNOSIS — B009 Herpesviral infection, unspecified: Secondary | ICD-10-CM

## 2023-06-20 DIAGNOSIS — F3181 Bipolar II disorder: Secondary | ICD-10-CM | POA: Diagnosis not present

## 2023-06-20 LAB — COMPREHENSIVE METABOLIC PANEL
ALT: 27 U/L (ref 0–35)
AST: 23 U/L (ref 0–37)
Albumin: 4.6 g/dL (ref 3.5–5.2)
Alkaline Phosphatase: 56 U/L (ref 39–117)
BUN: 14 mg/dL (ref 6–23)
CO2: 24 mEq/L (ref 19–32)
Calcium: 9.5 mg/dL (ref 8.4–10.5)
Chloride: 106 mEq/L (ref 96–112)
Creatinine, Ser: 0.66 mg/dL (ref 0.40–1.20)
GFR: 95.77 mL/min (ref >60.00)
Glucose, Bld: 99 mg/dL (ref 70–99)
Potassium: 4 mEq/L (ref 3.5–5.1)
Sodium: 141 mEq/L (ref 135–145)
Total Bilirubin: 0.5 mg/dL (ref 0.2–1.2)
Total Protein: 6.7 g/dL (ref 6.0–8.3)

## 2023-06-20 LAB — CBC WITH DIFFERENTIAL/PLATELET
Basophils Absolute: 0 10*3/uL (ref 0.0–0.1)
Basophils Relative: 0.2 % (ref 0.0–3.0)
Eosinophils Absolute: 0.1 10*3/uL (ref 0.0–0.7)
Eosinophils Relative: 1.3 % (ref 0.0–5.0)
HCT: 40.3 % (ref 36.0–46.0)
Hemoglobin: 13.3 g/dL (ref 12.0–15.0)
Lymphocytes Relative: 31.2 % (ref 12.0–46.0)
Lymphs Abs: 1.7 10*3/uL (ref 0.7–4.0)
MCHC: 33 g/dL (ref 30.0–36.0)
MCV: 92.3 fl (ref 78.0–100.0)
Monocytes Absolute: 0.4 10*3/uL (ref 0.1–1.0)
Monocytes Relative: 7.3 % (ref 3.0–12.0)
Neutro Abs: 3.3 10*3/uL (ref 1.4–7.7)
Neutrophils Relative %: 60 % (ref 43.0–77.0)
Platelets: 306 10*3/uL (ref 150.0–400.0)
RBC: 4.37 Mil/uL (ref 3.87–5.11)
RDW: 13.3 % (ref 11.5–15.5)
WBC: 5.5 10*3/uL (ref 4.0–10.5)

## 2023-06-20 LAB — LDL CHOLESTEROL, DIRECT: Direct LDL: 123 mg/dL

## 2023-06-20 LAB — LIPID PANEL
Cholesterol: 180 mg/dL (ref 0–200)
HDL: 41 mg/dL (ref >39.00)
NonHDL: 138.92
Total CHOL/HDL Ratio: 4
Triglycerides: 250 mg/dL — ABNORMAL HIGH (ref 0.0–149.0)
VLDL: 50 mg/dL — ABNORMAL HIGH (ref 0.0–40.0)

## 2023-06-20 LAB — HEMOGLOBIN A1C: Hgb A1c MFr Bld: 5.5 % (ref 4.6–6.5)

## 2023-06-20 MED ORDER — VALACYCLOVIR HCL 1 G PO TABS: ORAL_TABLET | ORAL | 1 refills | Status: AC

## 2023-06-20 MED ORDER — FENOFIBRATE 160 MG PO TABS: ORAL_TABLET | ORAL | 3 refills | Status: DC

## 2023-06-20 MED ORDER — ATORVASTATIN CALCIUM 20 MG PO TABS: 20.0000 mg | ORAL_TABLET | Freq: Every day | ORAL | 3 refills | Status: DC

## 2023-06-20 MED ORDER — TELMISARTAN 20 MG PO TABS: 20.0000 mg | ORAL_TABLET | Freq: Every day | ORAL | 3 refills | Status: DC

## 2023-06-20 NOTE — Patient Instructions (Signed)
It was great to see you!  Cut your mobic in half -- take 7.5 mg (I can send this in for you if needed)  Start telmisartan 20 mg daily -- check blood pressure a few times a week and message me averages in 1 month -- sooner if concerns  Please go to the lab for blood work.   Our office will call you with your results unless you have chosen to receive results via MyChart.  If your blood work is normal we will follow-up each year for physicals and as scheduled for chronic medical problems.  If anything is abnormal we will treat accordingly and get you in for a follow-up.  Take care,  Lelon Mast

## 2023-06-20 NOTE — Progress Notes (Signed)
Subjective:    Holly Richardson is a 62 y.o. female and is here for a comprehensive physical exam.  HPI  There are no preventive care reminders to display for this patient.   Acute Concerns: None.  Chronic Issues: Hypertension: Reports that she has recently been experiencing headaches.  States she is not taking any pain relief to specifically target headaches since she is taking 15 mg Meloxicam.  Due to the headaches she began monitoring her blood pressure at home. Previously managed with 20 mg Telmisartan and Keto diet. BP Readings from Last 3 Encounters:  06/20/23 (!) 140/90  02/02/23 (!) 144/80  11/15/22 (!) 140/96   Hyperlipidemia: Compliant with 160 mg Fenofibrate and 20 mg Lipitor. States she has not been on Lipitor for 3 days due to script running out and refill being denied.  Lab Results  Component Value Date   CHOL 141 04/21/2022   HDL 34.20 (L) 04/21/2022   LDLCALC 136 (H) 12/04/2020   LDLDIRECT 68.0 04/21/2022   TRIG 290.0 (H) 04/21/2022   CHOLHDL 4 04/21/2022   HSV Needs refill on as needed valtrex Denies concerns  Health Maintenance: Immunizations -- UTD Colonoscopy -- Last done 11/18/19. Diverticulosis found in ascending colon; internal hemorrhoids found.  Mammogram -- Last done 03/15/23. Stable post biopsy changes in bilateral breasts; no new/suspicious findings bilaterally; parenchymal pattern unchanged.  PAP -- UTD Bone Density -- N/A Diet -- Healthy overall.  Exercise -- Not regularly.  Sleep habits -- Good sleep quality; no concerns.  Mood -- Stable  UTD with dentist? - Yes UTD with eye doctor? - Yes  Weight history: Wt Readings from Last 10 Encounters:  06/20/23 196 lb (88.9 kg)  02/02/23 194 lb (88 kg)  11/15/22 187 lb 6.1 oz (85 kg)  10/18/22 181 lb (82.1 kg)  08/05/22 188 lb (85.3 kg)  04/21/22 196 lb 6.4 oz (89.1 kg)  01/03/22 181 lb (82.1 kg)  12/10/21 181 lb (82.1 kg)  10/26/21 178 lb (80.7 kg)  05/24/21 188 lb (85.3 kg)    Body mass index is 33.38 kg/m. No LMP recorded. Patient is postmenopausal.  Alcohol use:  reports current alcohol use.  Tobacco use:  Tobacco Use: Low Risk  (06/20/2023)   Patient History    Smoking Tobacco Use: Never    Smokeless Tobacco Use: Never    Passive Exposure: Not on file   Eligible for lung cancer screening? no     06/20/2023   10:43 AM  Depression screen PHQ 2/9  Decreased Interest 1  Down, Depressed, Hopeless 1  PHQ - 2 Score 2  Altered sleeping 1  Tired, decreased energy 1  Change in appetite 1  Feeling bad or failure about yourself  1  Trouble concentrating 1  Moving slowly or fidgety/restless 0  Suicidal thoughts 0  PHQ-9 Score 7  Difficult doing work/chores Not difficult at all     Other providers/specialists: Patient Care Team: [name], [place] as PCP - General (Physician Assistant) Nahser, Deloris Ping, MD as Consulting Physician (Cardiology) Bjorn Pippin, MD as Attending Physician (Urology) Hollar, Ronal Fear, MD as Referring Physician (Dermatology) Jimmey Ralph, Forest Becker, DO (Osteopathic Medicine) Mitchel Honour, DO as Consulting Physician (Obstetrics and Gynecology) Ivery Quale., DPM as Consulting Physician (Podiatry) Bjorn Pippin, MD as Consulting Physician (Orthopedic Surgery) Serita Kyle, PT as Physical Therapist (Physical Therapy) Wiley, Carlye Grippe, [facility] as Counselor (Psychiatry) Charlott Rakes, MD as Consulting Physician (Gastroenterology) [name], [place] as Physician Assistant (Physician Assistant)  PMHx, SurgHx, SocialHx, Medications, and Allergies were reviewed in the Visit Navigator and updated as appropriate.   Past Medical History:  Diagnosis Date   Anorexia 1982   Anxiety    Arthritis    Depression    Heart murmur    History of chicken pox    Hyperlipidemia    Hypertension    Kidney stones    Vaginal delivery 1992     Past Surgical History:  Procedure Laterality Date   ABDOMINAL SURGERY      APPENDECTOMY     Bladder Tack     BREAST BIOPSY Left 2015   BREAST BIOPSY Right 2018   BREAST BIOPSY Right 12/21/2021   CESAREAN SECTION  1994   TONSILLECTOMY AND ADENOIDECTOMY       Family History  Problem Relation Age of Onset   CAD Mother    Arthritis Mother    Heart disease Mother    Hypertension Mother    Kidney disease Mother        refusing HD   Hypertension Father    Heart disease Father    Stroke Father    Cystic fibrosis Son     Social History   Tobacco Use   Smoking status: Never   Smokeless tobacco: Never  Vaping Use   Vaping status: Never Used  Substance Use Topics   Alcohol use: Yes    Comment: mostly less often   Drug use: No    Review of Systems:   Review of Systems  Constitutional:  Negative for chills, fever, malaise/fatigue and weight loss.  HENT:  Negative for hearing loss, sinus pain and sore throat.   Respiratory:  Negative for cough and hemoptysis.   Cardiovascular:  Negative for chest pain, palpitations, leg swelling and PND.  Gastrointestinal:  Negative for abdominal pain, constipation, diarrhea, heartburn, nausea and vomiting.  Genitourinary:  Negative for dysuria, frequency and urgency.  Musculoskeletal:  Negative for back pain, myalgias and neck pain.  Skin:  Negative for itching and rash.  Neurological:  Positive for headaches. Negative for dizziness, tingling and seizures.  Endo/Heme/Allergies:  Negative for polydipsia.  Psychiatric/Behavioral:  Negative for depression. The patient is not nervous/anxious.     Objective:   BP (!) 140/90 (BP Location: Left Arm, Patient Position: Sitting, Cuff Size: Large)   Pulse 74   Temp (!) 97.3 F (36.3 C) (Temporal)   Ht 5' 4.25" (1.632 m)   Wt 196 lb (88.9 kg)   SpO2 96%   BMI 33.38 kg/m  Body mass index is 33.38 kg/m.   General Appearance:    Alert, cooperative, no distress, appears stated age  Head:    Normocephalic, without obvious abnormality, atraumatic  Eyes:    PERRL,  conjunctiva/corneas clear, EOM's intact, fundi    benign, both eyes  Ears:    Normal TM's and external ear canals, both ears  Nose:   Nares normal, septum midline, mucosa normal, no drainage    or sinus tenderness  Throat:   Lips, mucosa, and tongue normal; teeth and gums normal  Neck:   Supple, symmetrical, trachea midline, no adenopathy;    thyroid:  no enlargement/tenderness/nodules; no carotid   bruit or JVD  Back:     Symmetric, no curvature, ROM normal, no CVA tenderness  Lungs:     Clear to auscultation bilaterally, respirations unlabored  Chest Wall:    No tenderness or deformity   Heart:    Regular rate and rhythm, S1 and S2 normal, no murmur, rub  or gallop  Breast Exam:    Deferred  Abdomen:     Soft, non-tender, bowel sounds active all four quadrants,    no masses, no organomegaly  Genitalia:    Deferred  Extremities:   Extremities normal, atraumatic, no cyanosis or edema  Pulses:   2+ and symmetric all extremities  Skin:   Skin color, texture, turgor normal, no rashes or lesions  Lymph nodes:   Cervical, supraclavicular, and axillary nodes normal  Neurologic:   CNII-XII intact, normal strength, sensation and reflexes    throughout    Assessment/Plan:   Routine physical examination Today patient counseled on age appropriate routine health concerns for screening and prevention, each reviewed and up to date or declined. Immunizations reviewed and up to date or declined. Labs ordered and reviewed. Risk factors for depression reviewed and negative. Hearing function and visual acuity are intact. ADLs screened and addressed as needed. Functional ability and level of safety reviewed and appropriate. Education, counseling and referrals performed based on assessed risks today. Patient provided with a copy of personalized plan for preventive services.  Bipolar 2 disorder (HCC), Rx Prozac and Lithium Planning to resume medications with her provider  Denies SI/HI  Insulin resistance,  diet-controlled Update A1c Continue working with RD  Essential hypertension, now controlled on no medication Above goal today No evidence of end-organ damage on my exam Recommend patient monitor home blood pressure at least a few times weekly Restart telmisartan 20 mg daily If home monitoring shows consistent elevation, or any symptom(s) develop, recommend reach out to [place] for further advice on next steps Asked her to send [place] MyChart message in a few weeks with averages Recommend reducing meloxicam to 7.5 mg or considering stopping  Hyperlipidemia LDL goal <130 Update lipid panel and adjust Lipitor 20 mg and fenofibrate 160 mg daily accordingly  HSV-1 (herpes simplex virus 1) infection Refill valtrex for as needed use  Obesity, unspecified classification, unspecified obesity type, unspecified whether serious comorbidity present Continue healthy lifestyle efforts  I,Emily Lagle,acting as a scribe for [employer], PA.,have documented all relevant documentation on the behalf of [name], PA,as directed by  [name], PA while in the presence of [name], [place].  I, [name], [place], have reviewed all documentation for this visit. The documentation on 06/20/23 for the exam, diagnosis, procedures, and orders are all accurate and complete.  [name], PA-C [facility]

## 2023-09-15 ENCOUNTER — Other Ambulatory Visit: Payer: Self-pay | Admitting: Physician Assistant

## 2023-09-27 NOTE — Progress Notes (Signed)
Error

## 2023-10-03 NOTE — Progress Notes (Signed)
Holly Richardson is a 62 y.o. female {ELNP/FU:31256} History of Present Illness:   Health Maintenance Due  Topic Date Due   Cervical Cancer Screening (HPV/Pap Cotest)  Never done   No chief complaint on file.  HPI Fatigue: {ELStarters:31163}         ***  ***  ***  ***  *** Past Medical History:  Diagnosis Date   Anorexia 1982   Anxiety    Arthritis    Depression    Heart murmur    History of chicken pox    Hyperlipidemia    Hypertension    Kidney stones    Vaginal delivery 1992    Social History   Tobacco Use   Smoking status: Never   Smokeless tobacco: Never  Vaping Use   Vaping status: Never Used  Substance Use Topics   Alcohol use: Yes    Comment: mostly less often   Drug use: No   Past Surgical History:  Procedure Laterality Date   ABDOMINAL SURGERY     APPENDECTOMY     Bladder Tack     BREAST BIOPSY Left 2015   BREAST BIOPSY Right 2018   BREAST BIOPSY Right 12/21/2021   CESAREAN SECTION  1994   TONSILLECTOMY AND ADENOIDECTOMY     Family History  Problem Relation Age of Onset   CAD Mother    Arthritis Mother    Heart disease Mother    Hypertension Mother    Kidney disease Mother        refusing HD   Hypertension Father    Heart disease Father    Stroke Father    Cystic fibrosis Son    Allergies  Allergen Reactions   Codeine     Headache   Current Medications:   Current Outpatient Medications:    albuterol (VENTOLIN HFA) 108 (90 Base) MCG/ACT inhaler, Inhale 2 puffs into the lungs every 6 (six) hours as needed for wheezing or shortness of breath., Disp: 8 g, Rfl: 0   ALPRAZolam (XANAX) 0.5 MG tablet, Take 0.25-0.5 mg by mouth daily as needed., Disp: , Rfl:    atorvastatin (LIPITOR) 20 MG tablet, Take 1 tablet (20 mg total) by mouth daily., Disp: 90 tablet, Rfl: 3   Cholecalciferol (VITAMIN D-1000 MAX ST) 25 MCG (1000 UT) tablet, TAKE ONE CAPSULE BY MOUTH DAILY, Disp: 90 tablet, Rfl: 0   estradiol (ESTRACE) 0.1 MG/GM vaginal  cream, Insert 0.5 g per vagina nightly x 2 weeks; then 2-3x/week, Disp: , Rfl:    fenofibrate 160 MG tablet, TAKE ONE (1) TABLET BY MOUTH EVERY DAY, Disp: 90 tablet, Rfl: 3   meloxicam (MOBIC) 15 MG tablet, Take by mouth., Disp: , Rfl:    telmisartan (MICARDIS) 20 MG tablet, Take 1 tablet (20 mg total) by mouth daily., Disp: 90 tablet, Rfl: 3   valACYclovir (VALTREX) 1000 MG tablet, Take 2 grams twice a day for 1 day for flare., Disp: 30 tablet, Rfl: 1  Review of Systems:   ROS See pertinent positives and negatives as per the HPI.  Vitals:   There were no vitals filed for this visit.   There is no height or weight on file to calculate BMI.  Physical Exam:   Physical Exam  Assessment and Plan:   There are no diagnoses linked to this encounter.            I,Emily Lagle,acting as a Neurosurgeon for [employer], PA.,have documented all relevant documentation on the behalf of [name], PA,as directed  by  [name], PA while in the presence of [name], [place].  *** (refresh reminder)  I, [name], PA, have reviewed all documentation for this visit. The documentation on 10/03/23 for the exam, diagnosis, procedures, and orders are all accurate and complete.  [name], PA-C

## 2023-10-04 ENCOUNTER — Encounter: Payer: Self-pay | Admitting: Physician Assistant

## 2023-10-04 ENCOUNTER — Ambulatory Visit: Payer: BC Managed Care – PPO | Admitting: Physician Assistant

## 2023-10-04 VITALS — BP 126/80 | HR 71 | Temp 97.1°F | Ht 64.25 in | Wt 194.5 lb

## 2023-10-04 DIAGNOSIS — E559 Vitamin D deficiency, unspecified: Secondary | ICD-10-CM | POA: Diagnosis not present

## 2023-10-04 DIAGNOSIS — R5383 Other fatigue: Secondary | ICD-10-CM | POA: Diagnosis not present

## 2023-10-04 DIAGNOSIS — I10 Essential (primary) hypertension: Secondary | ICD-10-CM

## 2023-10-04 DIAGNOSIS — F3181 Bipolar II disorder: Secondary | ICD-10-CM

## 2023-10-04 LAB — COMPREHENSIVE METABOLIC PANEL
ALT: 15 U/L (ref 0–35)
AST: 15 U/L (ref 0–37)
Albumin: 4.5 g/dL (ref 3.5–5.2)
Alkaline Phosphatase: 49 U/L (ref 39–117)
BUN: 14 mg/dL (ref 6–23)
CO2: 24 meq/L (ref 19–32)
Calcium: 9.5 mg/dL (ref 8.4–10.5)
Chloride: 109 meq/L (ref 96–112)
Creatinine, Ser: 0.64 mg/dL (ref 0.40–1.20)
GFR: 96.29 mL/min (ref >60.00)
Glucose, Bld: 96 mg/dL (ref 70–99)
Potassium: 4.1 meq/L (ref 3.5–5.1)
Sodium: 140 meq/L (ref 135–145)
Total Bilirubin: 0.6 mg/dL (ref 0.2–1.2)
Total Protein: 6.3 g/dL (ref 6.0–8.3)

## 2023-10-04 LAB — CBC WITH DIFFERENTIAL/PLATELET
Basophils Absolute: 0 10*3/uL (ref 0.0–0.1)
Basophils Relative: 0.2 % (ref 0.0–3.0)
Eosinophils Absolute: 0.1 10*3/uL (ref 0.0–0.7)
Eosinophils Relative: 2.3 % (ref 0.0–5.0)
HCT: 37.3 % (ref 36.0–46.0)
Hemoglobin: 12.6 g/dL (ref 12.0–15.0)
Lymphocytes Relative: 26.7 % (ref 12.0–46.0)
Lymphs Abs: 1.4 10*3/uL (ref 0.7–4.0)
MCHC: 33.9 g/dL (ref 30.0–36.0)
MCV: 94 fL (ref 78.0–100.0)
Monocytes Absolute: 0.4 10*3/uL (ref 0.1–1.0)
Monocytes Relative: 7.7 % (ref 3.0–12.0)
Neutro Abs: 3.4 10*3/uL (ref 1.4–7.7)
Neutrophils Relative %: 63.1 % (ref 43.0–77.0)
Platelets: 309 10*3/uL (ref 150.0–400.0)
RBC: 3.96 Mil/uL (ref 3.87–5.11)
RDW: 13 % (ref 11.5–15.5)
WBC: 5.4 10*3/uL (ref 4.0–10.5)

## 2023-10-04 LAB — IBC + FERRITIN
Ferritin: 384.3 ng/mL — ABNORMAL HIGH (ref 10.0–291.0)
Iron: 116 ug/dL (ref 42–145)
Saturation Ratios: 31 % (ref 20.0–50.0)
TIBC: 373.8 ug/dL (ref 250.0–450.0)
Transferrin: 267 mg/dL (ref 212.0–360.0)

## 2023-10-04 LAB — VITAMIN B12: Vitamin B-12: 151 pg/mL — ABNORMAL LOW (ref 211–911)

## 2023-10-04 LAB — TSH: TSH: 2.07 u[IU]/mL (ref 0.35–5.50)

## 2023-10-04 LAB — VITAMIN D 25 HYDROXY (VIT D DEFICIENCY, FRACTURES): VITD: 21.65 ng/mL — ABNORMAL LOW (ref 30.00–100.00)

## 2023-10-04 NOTE — Patient Instructions (Addendum)
It was great to see you!  Let's update blood work today  Consider moving up your bedtime  Consider sleep study  Take Vitamin D WITH FOOD  This could very well be long-haul COVID and caregiver burden  Keep me posted on all of your symptoms

## 2023-10-05 LAB — LITHIUM LEVEL: Lithium Lvl: 0.8 mmol/L (ref 0.6–1.2)

## 2023-10-07 ENCOUNTER — Other Ambulatory Visit: Payer: Self-pay | Admitting: Physician Assistant

## 2023-10-07 DIAGNOSIS — R7989 Other specified abnormal findings of blood chemistry: Secondary | ICD-10-CM

## 2023-10-07 MED ORDER — VITAMIN D (ERGOCALCIFEROL) 1.25 MG (50000 UNIT) PO CAPS: 50000.0000 [IU] | ORAL_CAPSULE | ORAL | 0 refills | Status: DC

## 2023-10-09 ENCOUNTER — Encounter: Payer: Self-pay | Admitting: Physician Assistant

## 2023-10-11 ENCOUNTER — Ambulatory Visit: Payer: BC Managed Care – PPO

## 2023-10-12 ENCOUNTER — Ambulatory Visit (INDEPENDENT_AMBULATORY_CARE_PROVIDER_SITE_OTHER): Payer: BC Managed Care – PPO

## 2023-10-12 DIAGNOSIS — E538 Deficiency of other specified B group vitamins: Secondary | ICD-10-CM | POA: Diagnosis not present

## 2023-10-12 MED ORDER — CYANOCOBALAMIN 1000 MCG/ML IJ SOLN
1000.0000 ug | Freq: Once | INTRAMUSCULAR | Status: AC
  Administered 2023-10-12: 1000 ug via INTRAMUSCULAR

## 2023-10-12 NOTE — Progress Notes (Signed)
Per orders of [name], [place], injection of B-12 given by Dorris Fetch in left deltoid. Patient tolerated injection well. Patient will make appointment for 1 week.

## 2023-10-18 ENCOUNTER — Ambulatory Visit: Payer: BC Managed Care – PPO

## 2023-10-19 ENCOUNTER — Ambulatory Visit: Payer: BC Managed Care – PPO

## 2023-10-19 DIAGNOSIS — E538 Deficiency of other specified B group vitamins: Secondary | ICD-10-CM | POA: Diagnosis not present

## 2023-10-19 MED ORDER — CYANOCOBALAMIN 1000 MCG/ML IJ SOLN
1000.0000 ug | Freq: Once | INTRAMUSCULAR | Status: AC
  Administered 2023-10-19: 1000 ug via INTRAMUSCULAR

## 2023-10-19 NOTE — Progress Notes (Signed)
Patient is in office today for a nurse visit for B12 Injection, per PCP's order. Patient Injection was given in the  Right deltoid. Patient tolerated injection well.

## 2023-10-25 ENCOUNTER — Ambulatory Visit: Payer: BC Managed Care – PPO

## 2023-10-26 ENCOUNTER — Other Ambulatory Visit (INDEPENDENT_AMBULATORY_CARE_PROVIDER_SITE_OTHER): Payer: BC Managed Care – PPO

## 2023-10-26 ENCOUNTER — Ambulatory Visit: Payer: BC Managed Care – PPO | Admitting: *Deleted

## 2023-10-26 DIAGNOSIS — R7989 Other specified abnormal findings of blood chemistry: Secondary | ICD-10-CM

## 2023-10-26 DIAGNOSIS — E538 Deficiency of other specified B group vitamins: Secondary | ICD-10-CM

## 2023-10-26 MED ORDER — CYANOCOBALAMIN 1000 MCG/ML IJ SOLN
1000.0000 ug | Freq: Once | INTRAMUSCULAR | Status: AC
  Administered 2023-10-26: 1000 ug via INTRAMUSCULAR

## 2023-10-26 NOTE — Progress Notes (Signed)
Per orders of [name], [place], injection of B 12 given in right deltoid per patient preference by Zacarias Pontes, CMA. Patient tolerated injection well. Patient reminded to schedule next injection.

## 2023-10-27 LAB — SEDIMENTATION RATE: Sed Rate: 1 mm/h (ref 0–30)

## 2023-10-27 LAB — C-REACTIVE PROTEIN: CRP: <1 mg/dL (ref 0.5–20.0)

## 2023-10-29 LAB — ANA: Anti Nuclear Antibody (ANA): POSITIVE — AB

## 2023-10-29 LAB — ANTI-NUCLEAR AB-TITER (ANA TITER): ANA Titer 1: 1:40 {titer} — ABNORMAL HIGH

## 2023-10-30 ENCOUNTER — Other Ambulatory Visit: Payer: Self-pay | Admitting: Physician Assistant

## 2023-10-30 ENCOUNTER — Encounter: Payer: Self-pay | Admitting: Physician Assistant

## 2023-10-30 DIAGNOSIS — R5383 Other fatigue: Secondary | ICD-10-CM

## 2023-10-30 DIAGNOSIS — R768 Other specified abnormal immunological findings in serum: Secondary | ICD-10-CM

## 2023-10-30 DIAGNOSIS — R7989 Other specified abnormal findings of blood chemistry: Secondary | ICD-10-CM

## 2023-11-03 ENCOUNTER — Ambulatory Visit
Admission: RE | Admit: 2023-11-03 | Discharge: 2023-11-03 | Disposition: A | Discharge status: Home / Self Care | Payer: BC Managed Care – PPO | Source: Ambulatory Visit | Attending: Physician Assistant

## 2023-11-03 DIAGNOSIS — R7989 Other specified abnormal findings of blood chemistry: Secondary | ICD-10-CM

## 2023-11-09 ENCOUNTER — Encounter: Payer: Self-pay | Admitting: Physician Assistant

## 2023-11-09 ENCOUNTER — Telehealth (INDEPENDENT_AMBULATORY_CARE_PROVIDER_SITE_OTHER): Payer: 59 | Admitting: Physician Assistant

## 2023-11-09 VITALS — Ht 64.25 in | Wt 186.0 lb

## 2023-11-09 DIAGNOSIS — R7989 Other specified abnormal findings of blood chemistry: Secondary | ICD-10-CM

## 2023-11-09 DIAGNOSIS — E785 Hyperlipidemia, unspecified: Secondary | ICD-10-CM | POA: Diagnosis not present

## 2023-11-09 DIAGNOSIS — K760 Fatty (change of) liver, not elsewhere classified: Secondary | ICD-10-CM

## 2023-11-09 NOTE — Progress Notes (Signed)
 I acted as a neurosurgeon for [employer], PA-C Arland Chute, LPN  Virtual Visit via Video Note   I, [name], PA, connected with  Holly Richardson  ([id number], [birth year]) on 11/09/23 at  9:40 AM EST by a video-enabled telemedicine application and verified that I am speaking with the correct person using two identifiers.  Location: Patient: Home Provider: [place] [facility] office   I discussed the limitations of evaluation and management by telemedicine and the availability of in person appointments. The patient expressed understanding and agreed to proceed.    History of Present Illness: Holly Richardson is a 63 y.o. who identifies as a female who was assigned female at birth, and is being seen today for test results.   Pt would like to discuss lab results from 12/19 and Imaging from 12/27. Blood work showed elevated ANA of 1:40 and ferritin of 384. She has an appointment with rheum in May to establish.  Stopped all alcohol as of 11/30 -- 2 cocktails daily Stopped meloxicam as of 11/30 but did have to take a tablet yesterday for joint pain.  She has restricted her diet significantly since getting these blood work results and imaging results.  She has restarted her atorvastatin  back in August. She would like her lipid panel rechecked.  Problems:  Patient Active Problem List   Diagnosis Date Noted   Irritable bowel syndrome with diarrhea 09/30/2019   Chronic pain of right knee 07/30/2019   Bipolar 2 disorder (HCC), Rx Prozac and Lithium  01/24/2019   Eating disorder in remission, Hx of anorexia as a teen, struggles with emotional and binge eating as an adult, followed by Psych, RD; Intuitive Eating 01/24/2019   Primary osteoarthritis of both knees, Rx Celebrex  11/28/2018   Bloating 03/16/2017   Insulin resistance, diet-controlled 10/24/2016   Cardiac murmur, 2016 ECHO: LVEF 60-65%, aortic valve sclerosis, trivial TR, ASCVD 2.8% 08/03/2015   Essential hypertension,  now controlled on no medication 05/31/2014   Hyperlipidemia LDL goal <130, Rx Zocor and Fenofibrate  05/31/2014   Recurrent major depressive disorder, in partial remission (HCC) 05/31/2014    Allergies:  Allergies  Allergen Reactions   Codeine     Headache   Medications:  Current Outpatient Medications:    albuterol  (VENTOLIN  HFA) 108 (90 Base) MCG/ACT inhaler, Inhale 2 puffs into the lungs every 6 (six) hours as needed for wheezing or shortness of breath., Disp: 8 g, Rfl: 0   ALPRAZolam (XANAX) 0.5 MG tablet, Take 0.25-0.5 mg by mouth daily as needed., Disp: , Rfl:    atorvastatin  (LIPITOR) 20 MG tablet, Take 1 tablet (20 mg total) by mouth daily., Disp: 90 tablet, Rfl: 3   Cholecalciferol (VITAMIN D -1000 MAX ST) 25 MCG (1000 UT) tablet, TAKE ONE CAPSULE BY MOUTH DAILY, Disp: 90 tablet, Rfl: 0   estradiol (ESTRACE) 0.1 MG/GM vaginal cream, Insert 0.5 g per vagina nightly x 2 weeks; then 2-3x/week, Disp: , Rfl:    fenofibrate  160 MG tablet, TAKE ONE (1) TABLET BY MOUTH EVERY DAY, Disp: 90 tablet, Rfl: 3   lithium  carbonate 300 MG capsule, Take 300 mg by mouth 2 (two) times daily with a meal., Disp: , Rfl:    meloxicam (MOBIC) 15 MG tablet, Take by mouth., Disp: , Rfl:    PROZAC 20 MG capsule, Take 20 mg by mouth daily., Disp: , Rfl:    telmisartan  (MICARDIS ) 20 MG tablet, Take 1 tablet (20 mg total) by mouth daily., Disp: 90 tablet, Rfl: 3   valACYclovir  (  VALTREX ) 1000 MG tablet, Take 2 grams twice a day for 1 day for flare., Disp: 30 tablet, Rfl: 1   Vitamin D , Ergocalciferol , (DRISDOL ) 1.25 MG (50000 UNIT) CAPS capsule, Take 1 capsule (50,000 Units total) by mouth every 7 (seven) days., Disp: 12 capsule, Rfl: 0  Observations/Objective: Patient is well-developed, well-nourished in no acute distress.  Resting comfortably  at home.  Head is normocephalic, atraumatic.  No labored breathing.  Speech is clear and coherent with logical content.  Patient is alert and oriented at baseline.     Assessment and Plan: Elevated ferritin level (Primary); Hepatic steatosis Due to her level of concern with these issues -- recommend referral to gastroenterology for further discussion and risk management  Will check thiamine and folate when return for blood work Continue B-12 supplementation Continue to avoid alcohol - Ambulatory referral to Gastroenterology - Folate; Future - Vitamin B1; Future  Hyperlipidemia LDL Update lipid panel when she is ready -- future orders placed Continue Lipitor 20 mg daily in the interim - Lipid Profile; Future  Follow Up Instructions: I discussed the assessment and treatment plan with the patient. The patient was provided an opportunity to ask questions and all were answered. The patient agreed with the plan and demonstrated an understanding of the instructions.  A copy of instructions were sent to the patient via MyChart unless otherwise noted below.   The patient was advised to call back or seek an in-person evaluation if the symptoms worsen or if the condition fails to improve as anticipated.  [name], [place]

## 2023-11-09 NOTE — Patient Instructions (Signed)
 Please call our office to schedule repeat labs -- this will be a lab-only appointment, you will not see me during this visit. I would like for you to repeat labs in 2-6 weeks.

## 2023-11-10 ENCOUNTER — Other Ambulatory Visit (INDEPENDENT_AMBULATORY_CARE_PROVIDER_SITE_OTHER): Payer: 59

## 2023-11-10 DIAGNOSIS — R7989 Other specified abnormal findings of blood chemistry: Secondary | ICD-10-CM | POA: Diagnosis not present

## 2023-11-10 DIAGNOSIS — K760 Fatty (change of) liver, not elsewhere classified: Secondary | ICD-10-CM | POA: Diagnosis not present

## 2023-11-10 DIAGNOSIS — E785 Hyperlipidemia, unspecified: Secondary | ICD-10-CM | POA: Diagnosis not present

## 2023-11-10 LAB — LIPID PANEL
Cholesterol: 164 mg/dL (ref 0–200)
HDL: 40.5 mg/dL (ref >39.00)
LDL Cholesterol: 89 mg/dL (ref 0–99)
NonHDL: 123.29
Total CHOL/HDL Ratio: 4
Triglycerides: 171 mg/dL — ABNORMAL HIGH (ref 0.0–149.0)
VLDL: 34.2 mg/dL (ref 0.0–40.0)

## 2023-11-10 LAB — FOLATE: Folate: 14.4 ng/mL (ref >5.9)

## 2023-11-13 ENCOUNTER — Telehealth: Payer: Self-pay | Admitting: Gastroenterology

## 2023-11-13 NOTE — Telephone Encounter (Signed)
 I will review when I can and reply with a follow-up recommendation note.  Ellwood Dense MD

## 2023-11-13 NOTE — Telephone Encounter (Signed)
 Good afternoon Dr. Legrand  The following patient is being referred to us  for Elevated ferritin level and Hepatic steatosis. She saw a GI provider once and moved and wants to have continued care within [facility]. Records are available with care everywhere. Please review and advise of scheduling. Thank you.

## 2023-11-14 LAB — VITAMIN B1: Vitamin B1 (Thiamine): 8 nmol/L (ref 8–30)

## 2023-11-16 NOTE — Telephone Encounter (Signed)
(  For documentation purposes-she may have had a visit with Dr. Bing Plume in 2015, based on encounter review in this EHR)  ___________________  Next available new patient appointment with any provider.  Ellwood Dense MD

## 2023-11-21 ENCOUNTER — Encounter: Payer: Self-pay | Admitting: Physician Assistant

## 2023-11-22 ENCOUNTER — Encounter: Payer: Self-pay | Admitting: Gastroenterology

## 2023-11-30 ENCOUNTER — Ambulatory Visit: Payer: 59

## 2023-11-30 DIAGNOSIS — E538 Deficiency of other specified B group vitamins: Secondary | ICD-10-CM

## 2023-11-30 MED ORDER — CYANOCOBALAMIN 1000 MCG/ML IJ SOLN
1000.0000 ug | Freq: Once | INTRAMUSCULAR | Status: AC
  Administered 2023-11-30: 1000 ug via INTRAMUSCULAR

## 2023-11-30 NOTE — Progress Notes (Signed)
Patient is in office today for a nurse visit for B12 Injection, per PCP's order. Patient Injection was given in the  Right deltoid. Patient tolerated injection well.

## 2023-12-15 NOTE — Progress Notes (Signed)
 Chief Complaint:elevated ferritin level, hepatic steatosis .  Primary GI Doctor: Dr. Leonia Raman  HPI: Patient is a 63 year old female patient with past medical history of IBS, bipolar, hypertension, hyperlipidemia, B 12 deficiency, anxiety and depression, who was referred to me by Alexander Iba, PA on 11/09/23 for elevated ferritin level and hepatic steatosis .    Interval History      Patient presents for evaluation of elevated ferritin and hepatic steatosis. She reports she has never had liver issues in the past. No new medications. She does report she had history of iron deficiency and on oral iron, however it has been several years. She has never given blood for that reason. Post menopausal wince age 89. Nonsmoker. She does admit to alcohol use. Prior to lab work she was drinking 2 cocktails daily during the week and 3-4 per night on the weekend without measuring liquor. She has cut it down to 2 cocktails most nights. She also has history of Hepatitis A, states she got it during a trip to [place] . Patient denies family history of liver disease or liver CA. Son with Cystic fibrosis.      She reports she also has history of IBS with  associated nausea and vomiting where she will have about one "flare up" per month. She reports the symptoms will last for a few hours then resolve. She will take ondansetron  for the nausea. She reports she has diarrhea during flare ups, but constipated otherwise. She was instructed to start Miralax by dietitian and PCP, which she admits she has not done. She complains of gas pains trapped in her right upper abdomen intermittently relieved with defecation. She does have history of hemorrhoids and anal fissures that cause her bleeding from time to time. Patient denies GERD or dysphagia. She reports stress can exacerbate her issues and she is currently caring for her elderly parents.  Wt Readings from Last 3 Encounters:  12/18/23 193 lb 8 oz (87.8 kg)  11/09/23 186 lb  (84.4 kg)  10/04/23 194 lb 8 oz (88.2 kg)    Past Medical History:  Diagnosis Date   Anorexia 1982   Anxiety    Arthritis    Bipolar 2 disorder (HCC), Rx Prozac and Lithium  01/24/2019   Cardiac murmur, 2016 ECHO: LVEF 60-65%, aortic valve sclerosis, trivial TR, ASCVD 2.8% 08/03/2015   Depression    Essential hypertension, now controlled on no medication 05/31/2014   Heart murmur    History of chicken pox    Hyperlipidemia LDL goal <130, Rx Zocor and Fenofibrate  05/31/2014   Hypertension    Irritable bowel syndrome with diarrhea 09/30/2019   Kidney stones    Primary osteoarthritis of both knees, Rx Celebrex  11/28/2018   Recurrent major depressive disorder, in partial remission (HCC) 05/31/2014   Vaginal delivery 1992    Past Surgical History:  Procedure Laterality Date   ABDOMINAL SURGERY     APPENDECTOMY     Bladder Tack     BREAST BIOPSY Left 2015   BREAST BIOPSY Right 2018   BREAST BIOPSY Right 12/21/2021   CESAREAN SECTION  1994   COLONOSCOPY  2015   [place], Jeananne Mighty, MD - [facility] [place], [place]   TONSILLECTOMY AND ADENOIDECTOMY      Current Outpatient Medications  Medication Sig Dispense Refill   albuterol  (VENTOLIN  HFA) 108 (90 Base) MCG/ACT inhaler Inhale 2 puffs into the lungs every 6 (six) hours as needed for wheezing or shortness of breath. (Patient taking differently: Inhale 2 puffs into  the lungs as needed for wheezing or shortness of breath.) 8 g 0   ALPRAZolam (XANAX) 0.5 MG tablet Take 0.25-0.5 mg by mouth as needed for anxiety.     atorvastatin  (LIPITOR) 20 MG tablet Take 1 tablet (20 mg total) by mouth daily. 90 tablet 3   Cholecalciferol (VITAMIN D -1000 MAX ST) 25 MCG (1000 UT) tablet TAKE ONE CAPSULE BY MOUTH DAILY 90 tablet 0   estradiol (ESTRACE) 0.1 MG/GM vaginal cream Place 1 Applicatorful vaginally as needed.     fenofibrate  160 MG tablet TAKE ONE (1) TABLET BY MOUTH EVERY DAY 90 tablet 3   lithium  carbonate 300 MG capsule Take 300 mg by mouth 2  (two) times daily with a meal.     meloxicam (MOBIC) 15 MG tablet Take by mouth.     PROZAC 20 MG capsule Take 20 mg by mouth daily.     telmisartan  (MICARDIS ) 20 MG tablet Take 1 tablet (20 mg total) by mouth daily. 90 tablet 3   valACYclovir  (VALTREX ) 1000 MG tablet Take 2 grams twice a day for 1 day for flare. (Patient taking differently: Take 1,000 mg by mouth as needed. Take 2 grams twice a day for 1 day for flare.) 30 tablet 1   Vitamin D , Ergocalciferol , (DRISDOL ) 1.25 MG (50000 UNIT) CAPS capsule Take 1 capsule (50,000 Units total) by mouth every 7 (seven) days. 12 capsule 0   No current facility-administered medications for this visit.    Allergies as of 12/18/2023 - Review Complete 12/18/2023  Allergen Reaction Noted   Codeine  08/03/2015    Family History  Problem Relation Age of Onset   CAD Mother    Arthritis Mother    Heart disease Mother    Hypertension Mother    Kidney disease Mother        refusing HD   Hypertension Father    Heart disease Father    Stroke Father    Cystic fibrosis Son    Review of Systems:    Constitutional: No weight loss, fever, chills, weakness or fatigue HEENT: Eyes: No change in vision               Ears, Nose, Throat:  No change in hearing or congestion Skin: No rash or itching Cardiovascular: No chest pain, chest pressure or palpitations   Respiratory: No SOB or cough Gastrointestinal: See HPI and otherwise negative Genitourinary: No dysuria or change in urinary frequency Neurological: No headache, dizziness or syncope Musculoskeletal: No new muscle or joint pain Hematologic: No bleeding or bruising Psychiatric: No history of depression or anxiety   Physical Exam:  Vital signs: BP 116/70   Pulse 70   Ht 5\' 4"  (1.626 m)   Wt 193 lb 8 oz (87.8 kg)   BMI 33.21 kg/m   Constitutional:   Pleasant Caucasian female appears to be in NAD, Well developed, Well nourished, alert and cooperative Eyes:   PEERL, EOMI. No icterus.  Conjunctiva pink. Neck:  Supple Throat: Oral cavity and pharynx without inflammation, swelling or lesion.  Respiratory: Respirations even and unlabored. Lungs clear to auscultation bilaterally.   No wheezes, crackles, or rhonchi.  Cardiovascular: Normal S1, S2. Regular rate and rhythm. No peripheral edema, cyanosis or pallor.  Gastrointestinal:  Soft, nondistended, nontender. No rebound or guarding. Hypoactive bowel sounds. No appreciable masses or hepatomegaly. Rectal:  Not performed.  Msk:  Symmetrical without gross deformities. Without edema, no deformity or joint abnormality.  Neurologic:  Alert and  oriented x4;  grossly normal  neurologically.  Skin:   Dry and intact without significant lesions or rashes. Psychiatric: Oriented to person, place and time. Demonstrates good judgement and reason without abnormal affect or behaviors.  RELEVANT LABS AND IMAGING: CBC    Latest Ref Rng & Units 10/04/2023    9:30 AM 06/20/2023   11:17 AM 04/21/2022    8:52 AM  CBC  WBC 4.0 - 10.5 K/uL 5.4  5.5  7.6   Hemoglobin 12.0 - 15.0 g/dL 29.5  28.4  13.2   Hematocrit 36.0 - 46.0 % 37.3  40.3  38.5   Platelets 150.0 - 400.0 K/uL 309.0  306.0  296.0      CMP     Latest Ref Rng & Units 10/04/2023    9:30 AM 06/20/2023   11:17 AM 04/21/2022    8:52 AM  CMP  Glucose 70 - 99 mg/dL 96  99  99   BUN 6 - 23 mg/dL 14  14  15    Creatinine 0.40 - 1.20 mg/dL 4.40  1.02  7.25   Sodium 135 - 145 mEq/L 140  141  140   Potassium 3.5 - 5.1 mEq/L 4.1  4.0  3.9   Chloride 96 - 112 mEq/L 109  106  107   CO2 19 - 32 mEq/L 24  24  25    Calcium  8.4 - 10.5 mg/dL 9.5  9.5  9.6   Total Protein 6.0 - 8.3 g/dL 6.3  6.7  6.9   Total Bilirubin 0.2 - 1.2 mg/dL 0.6  0.5  0.5   Alkaline Phos 39 - 117 U/L 49  56  58   AST 0 - 37 U/L 15  23  17    ALT 0 - 35 U/L 15  27  18      Lab Results  Component Value Date   TSH 2.07 10/04/2023  10/26/23 labs show:  folate 14.4, ANA 1:40, ANA positive, Sed rate 1, CRP <1.0,   10/04/23 labs show: iron 116, ferritin 384.3, vitamin d  21.65, b12 151, tsh 2.07  11/03/23 US  Abd limited RUQ IMPRESSION: 1. Increased hepatic parenchymal echogenicity suggestive of steatosis. 2. No cholelithiasis or sonographic evidence for acute cholecystitis.  09/08/2014 colonoscopy with Dr Nathanael Baker FINDINGS:  Mild Diverticulosis of the Left colon was noted.  A 0.8 cm. polyp was noted at 75 cm. corresponding to the ascending colon which was removed by hot snare polypectomy and was sent to pathology for analysis.  Small internal hemorrhoids were noted on retroflex view.  11/18/19 colonoscopy, recall 11/2029 Impression: Diverticulosis in the ascending colon. Internal hemorrhoids. No specimens collected.  Assessment: Encounter Diagnoses  Name Primary?   Elevated ferritin Yes   Hepatic steatosis    Irritable bowel syndrome with both constipation and diarrhea    Nausea and vomiting, unspecified vomiting type    Positive ANA (antinuclear antibody)    Alcohol abuse       63 year old female patient who prevents with elevated ferritin and hepatic steatosis seen on abdominal ultrasound. No new medications. She has normal LFTs and kidney function. However recently had elevated ANA. CRP normal. Would like to order lab work to rule out autoimmune hepatitis. She also has history of Hep A, will go ahead and do full hepatitis panel. She does consume more than 8 drinks per week and we discussed how this can affect the liver. With elevated ferritin will also rule out hemochromatosis , women often diagnosed after menopause. Recommend she follow Mediterranean diet and work with her dietitian  in addition to physical exercise as tolerated.    After discussing her IBS symptoms I suspect she is experiencing obstipation and we discussed how the daily Miralax works to regulate her bowels and get things moving to prevent the episodes where she gets backed up followed by diarrhea and nausea. She agrees to  try this for the next 4 weeks to see if it helps.   Plan: - Start Miralax po daily  - Can use antiemetics prn - Check AMA, Anti-smooth muscle antibody, Hepatitis A IgG, Hepatitis B surface antigen, Hepatitis B surface antibody, HCV antibody, ferritin, TIBC,  Alpha 1 antitrypsin, ceruloplasmin, tTG, total IgA, hemochromotosis DNA PCR -Alcohol cessation education provided -Healthy diet with exercise as tolerated -Follow-up with me in 3 months  Thank you for the courtesy of this consult. Please call me with any questions or concerns.   Jedidiah Demartini, FNP-C [facility] 12/18/2023, 4:25 PM  Cc: Alexander Iba, [place]

## 2023-12-18 ENCOUNTER — Encounter: Payer: Self-pay | Admitting: Gastroenterology

## 2023-12-18 ENCOUNTER — Other Ambulatory Visit (INDEPENDENT_AMBULATORY_CARE_PROVIDER_SITE_OTHER): Payer: 59

## 2023-12-18 ENCOUNTER — Ambulatory Visit: Payer: 59 | Admitting: Gastroenterology

## 2023-12-18 VITALS — BP 116/70 | HR 70 | Ht 64.0 in | Wt 193.5 lb

## 2023-12-18 DIAGNOSIS — Z8619 Personal history of other infectious and parasitic diseases: Secondary | ICD-10-CM

## 2023-12-18 DIAGNOSIS — F1010 Alcohol abuse, uncomplicated: Secondary | ICD-10-CM

## 2023-12-18 DIAGNOSIS — K760 Fatty (change of) liver, not elsewhere classified: Secondary | ICD-10-CM

## 2023-12-18 DIAGNOSIS — R768 Other specified abnormal immunological findings in serum: Secondary | ICD-10-CM

## 2023-12-18 DIAGNOSIS — R112 Nausea with vomiting, unspecified: Secondary | ICD-10-CM | POA: Diagnosis not present

## 2023-12-18 DIAGNOSIS — K582 Mixed irritable bowel syndrome: Secondary | ICD-10-CM

## 2023-12-18 DIAGNOSIS — R7989 Other specified abnormal findings of blood chemistry: Secondary | ICD-10-CM

## 2023-12-18 DIAGNOSIS — R7689 Other specified abnormal immunological findings in serum: Secondary | ICD-10-CM

## 2023-12-18 NOTE — Patient Instructions (Signed)
 Per Deanna please reduce alcohol consumption.  Exercise as tolerated.  Mediterranean Diet A Mediterranean diet is based on the traditions of countries on the [employer]. It focuses on eating more: Fruits and vegetables. Whole grains, beans, nuts, and seeds. Heart-healthy fats. These are fats that are good for your heart. It involves eating less: Dairy. Meat and eggs. Processed foods with added sugar, salt, and fat. This type of diet can help prevent certain conditions. It can also improve outcomes if you have a long-term (chronic) disease, such as kidney or heart disease. What are tips for following this plan? Reading food labels Check packaged foods for: The serving size. For foods such as rice and pasta, the serving size is the amount of cooked product, not dry. The total fat. Avoid foods with saturated fat or trans fat. Added sugars, such as corn syrup. Shopping  Try to have a balanced diet. Buy a variety of foods, such as: Fresh fruits and vegetables. You may be able to get these from local farmers markets. You can also buy them frozen. Grains, beans, nuts, and seeds. Some of these can be bought in bulk. Fresh seafood. Poultry and eggs. Low-fat dairy products. Buy whole ingredients instead of foods that have already been packaged. If you can't get fresh seafood, buy precooked frozen shrimp or canned fish, such as tuna, salmon, or sardines. Stock your pantry so you always have certain foods on hand, such as olive oil, canned tuna, canned tomatoes, rice, pasta, and beans. Cooking Cook foods with extra-virgin olive oil instead of using butter or other vegetable oils. Have meat as a side dish. Have vegetables or grains as your main dish. This means having meat in small portions or adding small amounts of meat to foods like pasta or stew. Use beans or vegetables instead of meat in common dishes like chili or lasagna. Try out different cooking methods. Try roasting, broiling,  steaming, and sauting vegetables. Add frozen vegetables to soups, stews, pasta, or rice. Add nuts or seeds for added healthy fats and plant protein at each meal. You can add these to yogurt, salads, or vegetable dishes. Marinate fish or vegetables using olive oil, lemon juice, garlic, and fresh herbs. Meal planning Plan to eat a vegetarian meal one day each week. Try to work up to two vegetarian meals, if possible. Eat seafood two or more times a week. Have healthy snacks on hand. These may include: Vegetable sticks with hummus. Greek yogurt. Fruit and nut trail mix. Eat balanced meals. These should include: Fruit: 2-3 servings a day. Vegetables: 4-5 servings a day. Low-fat dairy: 2 servings a day. Fish, poultry, or lean meat: 1 serving a day. Beans and legumes: 2 or more servings a week. Nuts and seeds: 1-2 servings a day. Whole grains: 6-8 servings a day. Extra-virgin olive oil: 3-4 servings a day. Limit red meat and sweets to just a few servings a month. Lifestyle  Try to cook and eat meals with your family. Drink enough fluid to keep your pee (urine) pale yellow. Be active every day. This includes: Aerobic exercise, which is exercise that causes your heart to beat faster. Examples include running and swimming. Leisure activities like gardening, walking, or housework. Get 7-8 hours of sleep each night. Drink red wine if your provider says you can. A glass of wine is 5 oz (150 mL). You may be allowed to have: Up to 1 glass a day if you're female and not pregnant. Up to 2 glasses a day if  you're female. What foods should I eat? Fruits Apples. Apricots. Avocado. Berries. Bananas. Cherries. Dates. Figs. Grapes. Lemons. Melon. Oranges. Peaches. Plums. Pomegranate. Vegetables Artichokes. Beets. Broccoli. Cabbage. Carrots. Eggplant. Green beans. Chard. Kale. Spinach. Onions. Leeks. Peas. Squash. Tomatoes. Peppers. Radishes. Grains Whole-grain pasta. Brown rice. Bulgur wheat. Polenta.  Couscous. Whole-wheat bread. Dwyane Glad. Meats and other proteins Beans. Almonds. Sunflower seeds. Pine nuts. Peanuts. Cod. Salmon. Scallops. Shrimp. Tuna. Tilapia. Clams. Oysters. Eggs. Chicken or [place] without skin. Dairy Low-fat milk. Cheese. Greek yogurt. Fats and oils Extra-virgin olive oil. Avocado oil. Grapeseed oil. Beverages Water. Red wine. Herbal tea. Sweets and desserts Greek yogurt with honey. Baked apples. Poached pears. Trail mix. Seasonings and condiments Basil. Cilantro. Coriander. Cumin. Mint. Parsley. Sage. Rosemary. Tarragon. Garlic. Oregano. Thyme. Pepper. Balsamic vinegar. Tahini. Hummus. Tomato sauce. Olives. Mushrooms. The items listed above may not be all the foods and drinks you can have. Talk to a dietitian to learn more. What foods should I limit? This is a list of foods that should be eaten rarely. Fruits Fruit canned in syrup. Vegetables Deep-fried potatoes, like [place] fries. Grains Packaged pasta or rice dishes. Cereal with added sugar. Snacks with added sugar. Meats and other proteins Beef. Pork. Lamb. Chicken or [place] with skin. Hot dogs. Helene Loader. Dairy Ice cream. Sour cream. Whole milk. Fats and oils Butter. Canola oil. Vegetable oil. Beef fat (tallow). Lard. Beverages Juice. Sugar-sweetened soft drinks. Beer. Liquor and spirits. Sweets and desserts Cookies. Cakes. Pies. Candy. Seasonings and condiments Mayonnaise. Pre-made sauces and marinades. The items listed above may not be all the foods and drinks you should limit. Talk to a dietitian to learn more. Where to find more information American Heart Association (AHA): heart.org This information is not intended to replace advice given to you by your health care provider. Make sure you discuss any questions you have with your health care provider. Document Revised: 02/05/2023 Document Reviewed: 02/05/2023 Elsevier Patient Education  2024 [employer].  Your provider has requested that  you go to the basement level for lab work before leaving today. Press "B" on the elevator. The lab is located at the first door on the left as you exit the elevator.  _______________________________________________________  If your blood pressure at your visit was 140/90 or greater, please contact your primary care physician to follow up on this.  _______________________________________________________  If you are age 14 or older, your body mass index should be between 23-30. Your Body mass index is 33.21 kg/m. If this is out of the aforementioned range listed, please consider follow up with your Primary Care Provider.  If you are age 49 or younger, your body mass index should be between 19-25. Your Body mass index is 33.21 kg/m. If this is out of the aformentioned range listed, please consider follow up with your Primary Care Provider.   ________________________________________________________  The  GI providers would like to encourage you to use MYCHART to communicate with providers for non-urgent requests or questions.  Due to long hold times on the telephone, sending your provider a message by [facility] may be a faster and more efficient way to get a response.  Please allow 48 business hours for a response.  Please remember that this is for non-urgent requests.  _______________________________________________________  Thank you for trusting me with your gastrointestinal care!   Deanna May, NP

## 2023-12-19 LAB — IBC + FERRITIN
Ferritin: 363.9 ng/mL — ABNORMAL HIGH (ref 10.0–291.0)
Iron: 111 ug/dL (ref 42–145)
Saturation Ratios: 28.7 % (ref 20.0–50.0)
TIBC: 386.4 ug/dL (ref 250.0–450.0)
Transferrin: 276 mg/dL (ref 212.0–360.0)

## 2023-12-21 ENCOUNTER — Other Ambulatory Visit: Payer: Self-pay

## 2023-12-21 DIAGNOSIS — R112 Nausea with vomiting, unspecified: Secondary | ICD-10-CM

## 2023-12-21 DIAGNOSIS — R7989 Other specified abnormal findings of blood chemistry: Secondary | ICD-10-CM

## 2023-12-21 DIAGNOSIS — B159 Hepatitis A without hepatic coma: Secondary | ICD-10-CM

## 2023-12-22 ENCOUNTER — Other Ambulatory Visit: Payer: 59

## 2023-12-22 ENCOUNTER — Ambulatory Visit (INDEPENDENT_AMBULATORY_CARE_PROVIDER_SITE_OTHER): Payer: 59 | Admitting: *Deleted

## 2023-12-22 DIAGNOSIS — Z23 Encounter for immunization: Secondary | ICD-10-CM | POA: Diagnosis not present

## 2023-12-22 DIAGNOSIS — B159 Hepatitis A without hepatic coma: Secondary | ICD-10-CM

## 2023-12-22 DIAGNOSIS — R7989 Other specified abnormal findings of blood chemistry: Secondary | ICD-10-CM

## 2023-12-23 LAB — HEPATITIS A ANTIBODY, IGM: Hep A IgM: NONREACTIVE

## 2023-12-25 ENCOUNTER — Other Ambulatory Visit: Payer: Self-pay | Admitting: Physician Assistant

## 2023-12-26 ENCOUNTER — Ambulatory Visit (INDEPENDENT_AMBULATORY_CARE_PROVIDER_SITE_OTHER): Payer: 59

## 2023-12-26 DIAGNOSIS — E538 Deficiency of other specified B group vitamins: Secondary | ICD-10-CM | POA: Diagnosis not present

## 2023-12-26 MED ORDER — CYANOCOBALAMIN 1000 MCG/ML IJ SOLN
1000.0000 ug | Freq: Once | INTRAMUSCULAR | Status: AC
  Administered 2023-12-26: 1000 ug via INTRAMUSCULAR

## 2023-12-26 NOTE — Progress Notes (Signed)
 Patient is in office today for a nurse visit for B12 Injection, per PCP's order. Patient Injection was given in the  Left deltoid. Patient tolerated injection well.

## 2023-12-27 LAB — ANTI-SMOOTH MUSCLE ANTIBODY, IGG: Actin (Smooth Muscle) Antibody (IGG): <20 U (ref <20)

## 2023-12-27 LAB — CERULOPLASMIN: Ceruloplasmin: 22 mg/dL (ref 14–48)

## 2023-12-27 LAB — MITOCHONDRIAL ANTIBODIES: Mitochondrial M2 Ab, IgG: <=20 U (ref <=20.0)

## 2023-12-27 LAB — HEMOCHROMATOSIS DNA-PCR(C282Y,H63D)

## 2023-12-27 LAB — TISSUE TRANSGLUTAMINASE ABS,IGG,IGA
(tTG) Ab, IgA: <1 U/mL
(tTG) Ab, IgG: <1 U/mL

## 2023-12-27 LAB — HEPATITIS A ANTIBODY, TOTAL: Hepatitis A AB,Total: REACTIVE — AB

## 2023-12-27 LAB — HEPATITIS B SURFACE ANTIBODY,QUALITATIVE: Hep B S Ab: NONREACTIVE

## 2023-12-27 LAB — ALPHA-1-ANTITRYPSIN: A-1 Antitrypsin, Ser: 143 mg/dL (ref 83–199)

## 2023-12-27 LAB — IGA: Immunoglobulin A: 82 mg/dL (ref 47–310)

## 2023-12-27 LAB — HEPATITIS C ANTIBODY: Hepatitis C Ab: NONREACTIVE

## 2023-12-27 LAB — HEPATITIS B SURFACE ANTIGEN: Hepatitis B Surface Ag: NONREACTIVE

## 2023-12-28 ENCOUNTER — Ambulatory Visit: Payer: 59

## 2024-01-01 ENCOUNTER — Ambulatory Visit (INDEPENDENT_AMBULATORY_CARE_PROVIDER_SITE_OTHER): Payer: 59 | Admitting: Physician Assistant

## 2024-01-01 VITALS — BP 126/80 | HR 67 | Temp 97.5°F | Ht 64.0 in | Wt 190.2 lb

## 2024-01-01 DIAGNOSIS — R0781 Pleurodynia: Secondary | ICD-10-CM

## 2024-01-01 DIAGNOSIS — R051 Acute cough: Secondary | ICD-10-CM

## 2024-01-01 MED ORDER — AMOXICILLIN-POT CLAVULANATE 875-125 MG PO TABS: 1.0000 | ORAL_TABLET | Freq: Two times a day (BID) | ORAL | 0 refills | Status: DC

## 2024-01-01 MED ORDER — PREDNISONE 20 MG PO TABS: 20.0000 mg | ORAL_TABLET | Freq: Two times a day (BID) | ORAL | 0 refills | Status: DC

## 2024-01-01 NOTE — Patient Instructions (Signed)
 It was great to see you!  Start prednisone and augmentin  Take care,  [name] PA-C

## 2024-01-01 NOTE — Progress Notes (Signed)
 Holly Richardson is a 63 y.o. female here for a new problem.  History of Present Illness:   Chief Complaint  Patient presents with   Cough    Pt c/o cough for over a week, coughing and expectorating thick yellow/green sputum. Pt started wheezing yesterday. Also c/o rib pain right side almost like when she had Pleurisy.      Cough Patient complains of a cough that has persisted for the past week. Associated symptoms include expectorating thick green/yellow sputum, nasal congestion, and occasional wheezing.  Reports taking her albuterol once to help relief symptoms.    Denies any sore throat or ear pain.  She states that her symptoms started after having the hepatitis B vaccination about 10 days ago.  No known exposure to any viral viruses. Her husband is exhibiting similar symptoms.   Rib/Back Pain She also complains of right-sided rib pain that radiates to her mid back. She previously thought pain was caused by excessive gas and/or constipation.  However, she has been taking Miralax which has been managing her constipation.  Back/rib pain started prior to her upper respiratory symptoms.  She states her symptoms are similar to when she had Pleurisy when she was in college.   Past Medical History:  Diagnosis Date   Anorexia 1982   Anxiety    Arthritis    Bipolar 2 disorder (HCC), Rx Prozac and Lithium 01/24/2019   Cardiac murmur, 2016 ECHO: LVEF 60-65%, aortic valve sclerosis, trivial TR, ASCVD 2.8% 08/03/2015   Depression    Essential hypertension, now controlled on no medication 05/31/2014   Heart murmur    History of chicken pox    Hyperlipidemia LDL goal <130, Rx Zocor and Fenofibrate 05/31/2014   Hypertension    Irritable bowel syndrome with diarrhea 09/30/2019   Kidney stones    Primary osteoarthritis of both knees, Rx Celebrex 11/28/2018   Recurrent major depressive disorder, in partial remission (HCC) 05/31/2014   Vaginal delivery 1992     Social History   Tobacco  Use   Smoking status: Never   Smokeless tobacco: Never  Vaping Use   Vaping status: Never Used  Substance Use Topics   Alcohol use: Yes    Comment: mostly less often   Drug use: No    Past Surgical History:  Procedure Laterality Date   ABDOMINAL SURGERY     APPENDECTOMY     Bladder Tack     BREAST BIOPSY Left 2015   BREAST BIOPSY Right 2018   BREAST BIOPSY Right 12/21/2021   CESAREAN SECTION  1994   COLONOSCOPY  2015   [place], Thomos Lemons, MD - [facility] [place], [place]   TONSILLECTOMY AND ADENOIDECTOMY      Family History  Problem Relation Age of Onset   CAD Mother    Arthritis Mother    Heart disease Mother    Hypertension Mother    Kidney disease Mother        refusing HD   Hypertension Father    Heart disease Father    Stroke Father    Cystic fibrosis Son     Allergies  Allergen Reactions   Codeine     Headache    Current Medications:   Current Outpatient Medications:    albuterol (VENTOLIN HFA) 108 (90 Base) MCG/ACT inhaler, Inhale 2 puffs into the lungs every 6 (six) hours as needed for wheezing or shortness of breath. (Patient taking differently: Inhale 2 puffs into the lungs as needed for wheezing or shortness of breath.),  Disp: 8 g, Rfl: 0   ALPRAZolam (XANAX) 0.5 MG tablet, Take 0.25-0.5 mg by mouth as needed for anxiety., Disp: , Rfl:    amoxicillin-clavulanate (AUGMENTIN) 875-125 MG tablet, Take 1 tablet by mouth 2 (two) times daily., Disp: 14 tablet, Rfl: 0   atorvastatin (LIPITOR) 20 MG tablet, Take 1 tablet (20 mg total) by mouth daily., Disp: 90 tablet, Rfl: 3   Cholecalciferol (VITAMIN D-1000 MAX ST) 25 MCG (1000 UT) tablet, TAKE ONE CAPSULE BY MOUTH DAILY, Disp: 90 tablet, Rfl: 0   estradiol (ESTRACE) 0.1 MG/GM vaginal cream, Place 1 Applicatorful vaginally as needed., Disp: , Rfl:    fenofibrate 160 MG tablet, TAKE ONE (1) TABLET BY MOUTH EVERY DAY, Disp: 90 tablet, Rfl: 3   lithium carbonate 300 MG capsule, Take 300 mg by mouth 2 (two) times daily  with a meal., Disp: , Rfl:    meloxicam (MOBIC) 15 MG tablet, Take by mouth., Disp: , Rfl:    predniSONE (DELTASONE) 20 MG tablet, Take 1 tablet (20 mg total) by mouth 2 (two) times daily with a meal., Disp: 10 tablet, Rfl: 0   PROZAC 20 MG capsule, Take 20 mg by mouth daily., Disp: , Rfl:    telmisartan (MICARDIS) 20 MG tablet, Take 1 tablet (20 mg total) by mouth daily., Disp: 90 tablet, Rfl: 3   valACYclovir (VALTREX) 1000 MG tablet, Take 2 grams twice a day for 1 day for flare. (Patient taking differently: Take 1,000 mg by mouth as needed. Take 2 grams twice a day for 1 day for flare.), Disp: 30 tablet, Rfl: 1   Review of Systems:   Review of Systems  HENT:  Positive for congestion. Negative for ear pain and sore throat.   Respiratory:  Positive for cough, sputum production and wheezing.   Gastrointestinal:  Positive for constipation.  Musculoskeletal:  Positive for back pain (rib pain).    Vitals:   Vitals:   01/01/24 1512  BP: 126/80  Pulse: 67  Temp: (!) 97.5 F (36.4 C)  TempSrc: Temporal  SpO2: 96%  Weight: 190 lb 4 oz (86.3 kg)  Height: 5\' 4"  (1.626 m)     Body mass index is 32.66 kg/m.  Physical Exam:   Physical Exam Vitals and nursing note reviewed.  Constitutional:      General: She is not in acute distress.    Appearance: She is well-developed. She is not ill-appearing or toxic-appearing.  HENT:     Head: Normocephalic and atraumatic.     Right Ear: Tympanic membrane, ear canal and external ear normal. Tympanic membrane is not erythematous, retracted or bulging.     Left Ear: Tympanic membrane, ear canal and external ear normal. Tympanic membrane is not erythematous, retracted or bulging.     Nose: Nose normal.     Right Sinus: No maxillary sinus tenderness or frontal sinus tenderness.     Left Sinus: No maxillary sinus tenderness or frontal sinus tenderness.     Mouth/Throat:     Pharynx: Uvula midline. No posterior oropharyngeal erythema.  Eyes:      General: Lids are normal.     Conjunctiva/sclera: Conjunctivae normal.  Neck:     Trachea: Trachea normal.  Cardiovascular:     Rate and Rhythm: Normal rate and regular rhythm.     Pulses: Normal pulses.     Heart sounds: Normal heart sounds, S1 normal and S2 normal.  Pulmonary:     Effort: Pulmonary effort is normal.     Breath sounds:  Normal breath sounds. No decreased breath sounds, wheezing, rhonchi or rales.  Musculoskeletal:     Comments: Tenderness to palpation to right thoracic rib area  Lymphadenopathy:     Cervical: No cervical adenopathy.  Skin:    General: Skin is warm and dry.  Neurological:     Mental Status: She is alert.     GCS: GCS eye subscore is 4. GCS verbal subscore is 5. GCS motor subscore is 6.  Psychiatric:        Speech: Speech normal.        Behavior: Behavior normal. Behavior is cooperative.     Assessment and Plan:   Acute cough No red flags on exam.   Will initiate Augmentin and prednisone per orders.  Discussed taking medications as prescribed.  Reviewed return precautions including new or worsening fever, SOB, new or worsening cough or other concerns.  Push fluids and rest.  I recommend that patient follow-up if symptoms worsen or persist despite treatment x 7-10 days, sooner if needed.  Rib pain No red flags Suspect musculoskeletal Trial prednisone as prescribed If lack of improvement or any worsening, low threshold to get imaging   [name], PA-C  I,Safa M Kadhim,acting as a scribe for [name], PA.,have documented all relevant documentation on the behalf of [name], PA,as directed by  [name], PA while in the presence of [name], [place].   I, [name], [place], have reviewed all documentation for this visit. The documentation on 01/01/24 for the exam, diagnosis, procedures, and orders are all accurate and complete.

## 2024-01-22 ENCOUNTER — Ambulatory Visit: Payer: 59

## 2024-01-22 DIAGNOSIS — Z23 Encounter for immunization: Secondary | ICD-10-CM | POA: Diagnosis not present

## 2024-01-30 ENCOUNTER — Other Ambulatory Visit: Payer: Self-pay | Admitting: Obstetrics & Gynecology

## 2024-01-30 DIAGNOSIS — Z1231 Encounter for screening mammogram for malignant neoplasm of breast: Secondary | ICD-10-CM

## 2024-02-08 ENCOUNTER — Telehealth: Admitting: Physician Assistant

## 2024-02-08 ENCOUNTER — Encounter: Payer: Self-pay | Admitting: Physician Assistant

## 2024-02-08 VITALS — BP 118/63 | HR 68 | Ht 64.0 in | Wt 185.0 lb

## 2024-02-08 DIAGNOSIS — R197 Diarrhea, unspecified: Secondary | ICD-10-CM

## 2024-02-08 MED ORDER — ONDANSETRON 4 MG PO TBDP: 4.0000 mg | ORAL_TABLET | Freq: Three times a day (TID) | ORAL | 1 refills | Status: AC | PRN

## 2024-02-08 MED ORDER — PROMETHAZINE HCL 25 MG PO TABS: 25.0000 mg | ORAL_TABLET | Freq: Four times a day (QID) | ORAL | 0 refills | Status: AC | PRN

## 2024-02-08 NOTE — Progress Notes (Signed)
 I acted as a Neurosurgeon for [employer], PA-C Corky Mull, LPN  Virtual Visit via Video Note   I, [name], PA, connected with  Holly Richardson  ([id number], 1961-08-01) on 02/08/24 at  9:40 AM EDT by a video-enabled telemedicine application and verified that I am speaking with the correct person using two identifiers.  Location: Patient: Home Provider: [facility] office   I discussed the limitations of evaluation and management by telemedicine and the availability of in person appointments. The patient expressed understanding and agreed to proceed.    History of Present Illness: Holly Richardson is a 63 y.o. who identifies as a female who was assigned female at birth, and is being seen today for diarrhea.   Pt c/o stomach bug since Monday night, vomited Mon- Wed now still has nausea, has been having constantly diarrhea since Tues morning, everything is going through her. Took Zofran last night little relief. Had chills but has subsided. Pt is still having pain right rib area, slight coughing and the area hurts when she coughs.  Her mother was recently in the hospital and had diarrhea. It was negative for C diff. Everyone in her family has had this gastrointestinal illness but she does not seem to be able to kick it.  Had a little bit of lightheadedness this morning when walking down the stairs.  She has not taken any of her regular medications since Tuesday due to feeling unwell. Denies blood in stool.  Problems:  Patient Active Problem List   Diagnosis Date Noted   Irritable bowel syndrome with diarrhea 09/30/2019   Chronic pain of right knee 07/30/2019   Bipolar 2 disorder (HCC), Rx Prozac and Lithium 01/24/2019   Eating disorder in remission, Hx of anorexia as a teen, struggles with emotional and binge eating as an adult, followed by Psych, RD; Intuitive Eating 01/24/2019   Primary osteoarthritis of both knees, Rx Celebrex 11/28/2018   Bloating  03/16/2017   Insulin resistance, diet-controlled 10/24/2016   Cardiac murmur, 2016 ECHO: LVEF 60-65%, aortic valve sclerosis, trivial TR, ASCVD 2.8% 08/03/2015   Essential hypertension, now controlled on no medication 05/31/2014   Hyperlipidemia LDL goal <130, Rx Zocor and Fenofibrate 05/31/2014   Recurrent major depressive disorder, in partial remission (HCC) 05/31/2014    Allergies:  Allergies  Allergen Reactions   Codeine     Headache   Medications:  Current Outpatient Medications:    albuterol (VENTOLIN HFA) 108 (90 Base) MCG/ACT inhaler, Inhale 2 puffs into the lungs every 6 (six) hours as needed for wheezing or shortness of breath. (Patient taking differently: Inhale 2 puffs into the lungs as needed for wheezing or shortness of breath.), Disp: 8 g, Rfl: 0   ALPRAZolam (XANAX) 0.5 MG tablet, Take 0.25-0.5 mg by mouth as needed for anxiety., Disp: , Rfl:    atorvastatin (LIPITOR) 20 MG tablet, Take 1 tablet (20 mg total) by mouth daily., Disp: 90 tablet, Rfl: 3   Cholecalciferol (VITAMIN D-1000 MAX ST) 25 MCG (1000 UT) tablet, TAKE ONE CAPSULE BY MOUTH DAILY, Disp: 90 tablet, Rfl: 0   estradiol (ESTRACE) 0.1 MG/GM vaginal cream, Place 1 Applicatorful vaginally as needed., Disp: , Rfl:    fenofibrate 160 MG tablet, TAKE ONE (1) TABLET BY MOUTH EVERY DAY, Disp: 90 tablet, Rfl: 3   lithium carbonate 300 MG capsule, Take 300 mg by mouth 2 (two) times daily with a meal., Disp: , Rfl:    meloxicam (MOBIC) 15 MG tablet, Take by  mouth., Disp: , Rfl:    Omega-3 Fatty Acids (FISH OIL) 1000 MG CAPS, Take 1 capsule by mouth daily in the afternoon., Disp: , Rfl:    ondansetron (ZOFRAN-ODT) 4 MG disintegrating tablet, Take 1 tablet (4 mg total) by mouth every 8 (eight) hours as needed for nausea or vomiting., Disp: 30 tablet, Rfl: 1   promethazine (PHENERGAN) 25 MG tablet, Take 1 tablet (25 mg total) by mouth every 6 (six) hours as needed for nausea or vomiting., Disp: 30 tablet, Rfl: 0   PROZAC 20  MG capsule, Take 20 mg by mouth daily., Disp: , Rfl:    telmisartan (MICARDIS) 20 MG tablet, Take 1 tablet (20 mg total) by mouth daily., Disp: 90 tablet, Rfl: 3   valACYclovir (VALTREX) 1000 MG tablet, Take 2 grams twice a day for 1 day for flare. (Patient taking differently: Take 1,000 mg by mouth as needed. Take 2 grams twice a day for 1 day for flare.), Disp: 30 tablet, Rfl: 1  Observations/Objective: Patient is well-developed, well-nourished in no acute distress.  Resting comfortably  at home.  Head is normocephalic, atraumatic.  No labored breathing.  Speech is clear and coherent with logical content.  Patient is alert and oriented at baseline.   Assessment and Plan: 1. Diarrhea of presumed infectious origin (Primary) No red flags She reports that her lightheadedness was a one-time episode Will send in additional Zofran for her to have and also some phenergan for nighttime Push fluids and maintain bland diet Hold blood pressure medication while having ongoing severe diarrhea Offered stool test but she declined  If symptom(s) worsen, recommend emergency room evaluation  Follow Up Instructions: I discussed the assessment and treatment plan with the patient. The patient was provided an opportunity to ask questions and all were answered. The patient agreed with the plan and demonstrated an understanding of the instructions.  A copy of instructions were sent to the patient via MyChart unless otherwise noted below.   The patient was advised to call back or seek an in-person evaluation if the symptoms worsen or if the condition fails to improve as anticipated.  [name], [place]

## 2024-02-22 ENCOUNTER — Other Ambulatory Visit (INDEPENDENT_AMBULATORY_CARE_PROVIDER_SITE_OTHER)

## 2024-02-22 ENCOUNTER — Ambulatory Visit: Admitting: Gastroenterology

## 2024-02-22 ENCOUNTER — Encounter: Payer: Self-pay | Admitting: Gastroenterology

## 2024-02-22 VITALS — BP 122/68 | HR 58 | Ht 64.0 in | Wt 188.0 lb

## 2024-02-22 DIAGNOSIS — Z148 Genetic carrier of other disease: Secondary | ICD-10-CM

## 2024-02-22 DIAGNOSIS — K760 Fatty (change of) liver, not elsewhere classified: Secondary | ICD-10-CM

## 2024-02-22 DIAGNOSIS — R7989 Other specified abnormal findings of blood chemistry: Secondary | ICD-10-CM | POA: Diagnosis not present

## 2024-02-22 LAB — HEPATIC FUNCTION PANEL
ALT: 20 U/L (ref 0–35)
AST: 18 U/L (ref 0–37)
Albumin: 4.6 g/dL (ref 3.5–5.2)
Alkaline Phosphatase: 54 U/L (ref 39–117)
Bilirubin, Direct: 0.1 mg/dL (ref 0.0–0.3)
Total Bilirubin: 0.5 mg/dL (ref 0.2–1.2)
Total Protein: 6.5 g/dL (ref 6.0–8.3)

## 2024-02-22 LAB — IBC + FERRITIN
Ferritin: 282.3 ng/mL (ref 10.0–291.0)
Iron: 102 ug/dL (ref 42–145)
Saturation Ratios: 26.6 % (ref 20.0–50.0)
TIBC: 383.6 ug/dL (ref 250.0–450.0)
Transferrin: 274 mg/dL (ref 212.0–360.0)

## 2024-02-22 NOTE — Patient Instructions (Addendum)
 Your provider has requested that you go to the basement level for lab work before leaving today. Press "B" on the elevator. The lab is located at the first door on the left as you exit the elevator.  Follow up in 6 months with Dr.Nandigam   You will be contacted regarding an appointment for Genetic counseling.  Due to recent changes in healthcare laws, you may see the results of your imaging and laboratory studies on MyChart before your provider has had a chance to review them.  We understand that in some cases there may be results that are confusing or concerning to you. Not all laboratory results come back in the same time frame and the provider may be waiting for multiple results in order to interpret others.  Please give us  48 hours in order for your provider to thoroughly review all the results before contacting the office for clarification of your results.   It was a pleasure to see you today!  Thank you for trusting me with your gastrointestinal care!

## 2024-02-22 NOTE — Progress Notes (Signed)
 Chief Complaint: follow-up fatty liver, elevated ferritin Primary GI Doctor:Dr. Lavon Paganini  HPI:   Patient is a 63 year old female patient with past medical history of IBS, bipolar, hypertension, hyperlipidemia, B 12 deficiency, anxiety and depression, who was referred to me by [name], PA on 11/09/23 for elevated ferritin level and hepatic steatosis.   12/18/23 patient presented for evaluation of elevated ferritin and hepatic steatosis.  She was also experiencing IBS symptoms prominently with constipation.  Recommended patient start MiraLAX daily. 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  Interval History    Patient presents for follow-up on elevated ferritin level and hepatic steatosis.  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  Patient states she has cut down on alcohol use, and only drinks margarita very seldom. She also incorporated Mediterranean diet which she states has helped regulate her bowels.  She does not have any current complaints today and very eager to eat healthy and maintain her ideal body weight.  Wt Readings from Last 3 Encounters:  02/22/24 188 lb (85.3 kg)  02/08/24 185 lb (83.9 kg)  01/01/24 190 lb 4 oz (86.3 kg)     Past Medical History:  Diagnosis Date   Anorexia 1982   Anxiety    Arthritis    Bipolar 2 disorder (HCC), Rx Prozac and Lithium 01/24/2019   Cardiac murmur, 2016 ECHO: LVEF 60-65%, aortic valve sclerosis, trivial TR, ASCVD 2.8% 08/03/2015   Depression    Essential hypertension, now controlled on no medication 05/31/2014    Heart murmur    History of chicken pox    Hyperlipidemia LDL goal <130, Rx Zocor and Fenofibrate 05/31/2014   Hypertension    Irritable bowel syndrome with diarrhea 09/30/2019   Kidney stones    Primary osteoarthritis of both knees, Rx Celebrex 11/28/2018   Recurrent major depressive disorder, in partial remission (HCC) 05/31/2014   Vaginal delivery 1992    Past Surgical History:  Procedure Laterality Date   ABDOMINAL SURGERY     APPENDECTOMY     Bladder Tack     BREAST BIOPSY Left 2015   BREAST BIOPSY Right 2018   BREAST BIOPSY Right 12/21/2021   CESAREAN SECTION  1994   COLONOSCOPY  2015   [place], Thomos Lemons, MD - [place] [place], [place]   TONSILLECTOMY AND ADENOIDECTOMY      Current Outpatient Medications  Medication Sig Dispense Refill   albuterol (VENTOLIN HFA) 108 (90 Base) MCG/ACT inhaler Inhale 2 puffs into the lungs every 6 (six) hours as needed for wheezing or shortness of breath. (Patient taking differently: Inhale 2 puffs into the lungs as needed for wheezing or shortness of breath.) 8 g 0   ALPRAZolam (XANAX) 0.5 MG tablet Take 0.25-0.5 mg by mouth as needed for anxiety.     atorvastatin (LIPITOR) 20 MG tablet Take 1 tablet (20 mg total) by mouth daily. 90 tablet 3   Cholecalciferol (VITAMIN D-1000 MAX ST) 25 MCG (1000 UT) tablet TAKE ONE CAPSULE BY MOUTH DAILY 90 tablet 0   estradiol (ESTRACE) 0.1 MG/GM vaginal cream Place 1 Applicatorful vaginally as needed.  fenofibrate 160 MG tablet TAKE ONE (1) TABLET BY MOUTH EVERY DAY 90 tablet 3   lithium carbonate 300 MG capsule Take 300 mg by mouth 2 (two) times daily with a meal.     meloxicam (MOBIC) 15 MG tablet Take by mouth.     Omega-3 Fatty Acids (FISH OIL) 1000 MG CAPS Take 1 capsule by mouth daily in the afternoon.     ondansetron (ZOFRAN-ODT) 4 MG disintegrating tablet Take 1 tablet (4 mg total) by mouth every 8 (eight) hours as needed for nausea or vomiting. 30 tablet 1   promethazine (PHENERGAN) 25 MG tablet  Take 1 tablet (25 mg total) by mouth every 6 (six) hours as needed for nausea or vomiting. 30 tablet 0   PROZAC 20 MG capsule Take 20 mg by mouth daily.     telmisartan (MICARDIS) 20 MG tablet Take 1 tablet (20 mg total) by mouth daily. 90 tablet 3   valACYclovir (VALTREX) 1000 MG tablet Take 2 grams twice a day for 1 day for flare. (Patient taking differently: Take 1,000 mg by mouth as needed. Take 2 grams twice a day for 1 day for flare.) 30 tablet 1   No current facility-administered medications for this visit.    Allergies as of 02/22/2024 - Review Complete 02/22/2024  Allergen Reaction Noted   Codeine  08/03/2015    Family History  Problem Relation Age of Onset   CAD Mother    Arthritis Mother    Heart disease Mother    Hypertension Mother    Kidney disease Mother        refusing HD   Hypertension Father    Heart disease Father    Stroke Father    Cystic fibrosis Son     Review of Systems:    Constitutional: No weight loss, fever, chills, weakness or fatigue HEENT: Eyes: No change in vision               Ears, Nose, Throat:  No change in hearing or congestion Skin: No rash or itching Cardiovascular: No chest pain, chest pressure or palpitations   Respiratory: No SOB or cough Gastrointestinal: See HPI and otherwise negative Genitourinary: No dysuria or change in urinary frequency Neurological: No headache, dizziness or syncope Musculoskeletal: No new muscle or joint pain Hematologic: No bleeding or bruising Psychiatric: No history of depression or anxiety    Physical Exam:  Vital signs: BP 122/68   Pulse (!) 58   Ht 5\' 4"  (1.626 m)   Wt 188 lb (85.3 kg)   BMI 32.27 kg/m   Constitutional:   Pleasant  female appears to be in NAD, Well developed, Well nourished, alert and cooperative Throat: Oral cavity and pharynx without inflammation, swelling or lesion.  Respiratory: Respirations even and unlabored. Lungs clear to auscultation bilaterally.   No wheezes,  crackles, or rhonchi.  Cardiovascular: Normal S1, S2. Regular rate and rhythm. No peripheral edema, cyanosis or pallor.  Gastrointestinal:  Soft, nondistended, nontender. No rebound or guarding. Normal bowel sounds. No appreciable masses or hepatomegaly. Rectal:  Not performed.  Msk:  Symmetrical without gross deformities. Without edema, no deformity or joint abnormality.  Neurologic:  Alert and  oriented x4;  grossly normal neurologically.  Skin:   Dry and intact without significant lesions or rashes. Psychiatric: Oriented to person, place and time. Demonstrates good judgement and reason without abnormal affect or behaviors.  RELEVANT LABS AND IMAGING: CBC    Latest Ref Rng & Units 10/04/2023    9:30  AM 06/20/2023   11:17 AM 04/21/2022    8:52 AM  CBC  WBC 4.0 - 10.5 K/uL 5.4  5.5  7.6   Hemoglobin 12.0 - 15.0 g/dL 16.1  09.6  04.5   Hematocrit 36.0 - 46.0 % 37.3  40.3  38.5   Platelets 150.0 - 400.0 K/uL 309.0  306.0  296.0      CMP     Latest Ref Rng & Units 10/04/2023    9:30 AM 06/20/2023   11:17 AM 04/21/2022    8:52 AM  CMP  Glucose 70 - 99 mg/dL 96  99  99   BUN 6 - 23 mg/dL 14  14  15    Creatinine 0.40 - 1.20 mg/dL 4.09  8.11  9.14   Sodium 135 - 145 mEq/L 140  141  140   Potassium 3.5 - 5.1 mEq/L 4.1  4.0  3.9   Chloride 96 - 112 mEq/L 109  106  107   CO2 19 - 32 mEq/L 24  24  25    Calcium 8.4 - 10.5 mg/dL 9.5  9.5  9.6   Total Protein 6.0 - 8.3 g/dL 6.3  6.7  6.9   Total Bilirubin 0.2 - 1.2 mg/dL 0.6  0.5  0.5   Alkaline Phos 39 - 117 U/L 49  56  58   AST 0 - 37 U/L 15  23  17    ALT 0 - 35 U/L 15  27  18       Lab Results  Component Value Date   TSH 2.07 10/04/2023  10/26/23 labs show:  folate 14.4, ANA 1:40, ANA positive, Sed rate 1, CRP <1.0,  10/04/23 labs show: iron 116, ferritin 384.3, vitamin d 21.65, b12 151, tsh 2.07  11/03/23 [place] Abd limited RUQ IMPRESSION: 1. Increased hepatic parenchymal echogenicity suggestive of steatosis. 2. No cholelithiasis  or sonographic evidence for acute cholecystitis.   09/08/2014 colonoscopy with Dr Bing Plume FINDINGS:  Mild Diverticulosis of the Left colon was noted.  A 0.8 cm. polyp was noted at 75 cm. corresponding to the ascending colon which was removed by hot snare polypectomy and was sent to pathology for analysis.  Small internal hemorrhoids were noted on retroflex view.   11/18/19 colonoscopy, recall 11/2029 Impression: Diverticulosis in the ascending colon. Internal hemorrhoids. No specimens collected.   Assessment: Encounter Diagnoses  Name Primary?   Fatty liver Yes   Hemochromatosis, unspecified hemochromatosis type    Elevated ferritin    Carrier of hemochromatosis HFE gene mutation        63 year old female patient that presented with elevated ferritin and hepatic steatosis.  Serology lab work revealed reactive Hepatitis A, follow-up Hep A IgM test result is non reactive, indicating no active infection.  Non reactive Hep B surface antigen and ab.  Patient states she never received the vaccinations as a kid and completed the series in our office. 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 Ceruloplasmin negative, Actin (Smooth Muscle) Antibody (IGG)  negative, Mitochondrial M2 Ab, IgG  negative, A-1 Antitrypsin, Ser- negative, celiac panel negative, FIB4 score= 0.75 advanced fibrosis unlikely.  Will order [facility] screening in June 2025 and recheck iron panel with ferritin and hepatic panel today.  Patient is highly motivated to continue with weight loss, dietary changes, and exercise as tolerated.  Patient has significantly decreased alcohol consumption.  Plan: -recheck iron panel, ferritin, hepatic panel today - Recall RUQ abdominal US (June  2025) -Recommend genetic counselor for H63D mutation  -continue weight loss, dietary changes, exercise as tolerated - colonoscopy, recall 11/2029 -6 months fup with Dr.  Nandigam   Thank you for the courtesy of this consult. Please call me with any questions or concerns.   Hila Bolding, FNP-C [facility] 02/22/2024, 3:53 PM  Cc: Alexander Iba, [place]

## 2024-02-23 ENCOUNTER — Encounter (HOSPITAL_BASED_OUTPATIENT_CLINIC_OR_DEPARTMENT_OTHER): Payer: Self-pay | Admitting: Emergency Medicine

## 2024-02-23 ENCOUNTER — Emergency Department (HOSPITAL_BASED_OUTPATIENT_CLINIC_OR_DEPARTMENT_OTHER)
Admission: EM | Admit: 2024-02-23 | Discharge: 2024-02-23 | Disposition: A | Discharge status: Home / Self Care | Attending: Emergency Medicine | Admitting: Emergency Medicine

## 2024-02-23 ENCOUNTER — Emergency Department (HOSPITAL_BASED_OUTPATIENT_CLINIC_OR_DEPARTMENT_OTHER)

## 2024-02-23 ENCOUNTER — Other Ambulatory Visit: Payer: Self-pay

## 2024-02-23 DIAGNOSIS — I10 Essential (primary) hypertension: Secondary | ICD-10-CM | POA: Insufficient documentation

## 2024-02-23 DIAGNOSIS — R072 Precordial pain: Secondary | ICD-10-CM | POA: Insufficient documentation

## 2024-02-23 LAB — BASIC METABOLIC PANEL WITH GFR
Anion gap: 8 (ref 5–15)
BUN: 12 mg/dL (ref 6–20)
CO2: 23 mmol/L (ref 22–32)
Calcium: 9.5 mg/dL (ref 8.9–10.3)
Chloride: 109 mmol/L (ref 98–111)
Creatinine, Ser: 0.69 mg/dL (ref 0.44–1.00)
GFR, Estimated: >60 mL/min (ref >60)
Glucose, Bld: 95 mg/dL (ref 70–99)
Potassium: 4.2 mmol/L (ref 3.5–5.1)
Sodium: 140 mmol/L (ref 135–145)

## 2024-02-23 LAB — CBC
HCT: 37.4 % (ref 36.0–46.0)
Hemoglobin: 12.4 g/dL (ref 12.0–15.0)
MCH: 30.3 pg (ref 26.0–34.0)
MCHC: 33.2 g/dL (ref 30.0–36.0)
MCV: 91.4 fL (ref 80.0–100.0)
Platelets: 299 10*3/uL (ref 150–400)
RBC: 4.09 MIL/uL (ref 3.87–5.11)
RDW: 13 % (ref 11.5–15.5)
WBC: 4.5 10*3/uL (ref 4.0–10.5)
nRBC: 0 % (ref 0.0–0.2)

## 2024-02-23 LAB — TROPONIN I (HIGH SENSITIVITY)
Troponin I (High Sensitivity): 2 ng/L (ref <18)
Troponin I (High Sensitivity): 2 ng/L (ref <18)

## 2024-02-23 LAB — D-DIMER, QUANTITATIVE: D-Dimer, Quant: 0.29 ug{FEU}/mL (ref 0.00–0.50)

## 2024-02-23 MED ORDER — ALUM & MAG HYDROXIDE-SIMETH 200-200-20 MG/5ML PO SUSP
30.0000 mL | Freq: Once | ORAL | Status: AC
  Administered 2024-02-23: 30 mL via ORAL
  Filled 2024-02-23: qty 30

## 2024-02-23 MED ORDER — HYDROMORPHONE HCL 1 MG/ML IJ SOLN
0.5000 mg | Freq: Once | INTRAMUSCULAR | Status: AC
  Administered 2024-02-23: 0.5 mg via INTRAVENOUS
  Filled 2024-02-23: qty 1

## 2024-02-23 MED ORDER — METHOCARBAMOL 500 MG PO TABS: 500.0000 mg | ORAL_TABLET | Freq: Two times a day (BID) | ORAL | 0 refills | Status: DC

## 2024-02-23 MED ORDER — METHOCARBAMOL 500 MG PO TABS
500.0000 mg | ORAL_TABLET | Freq: Once | ORAL | Status: AC
  Administered 2024-02-23: 500 mg via ORAL
  Filled 2024-02-23: qty 1

## 2024-02-23 MED ORDER — LIDOCAINE 5 % EX PTCH
1.0000 | MEDICATED_PATCH | CUTANEOUS | Status: DC
  Administered 2024-02-23: 1 via TRANSDERMAL
  Filled 2024-02-23: qty 1

## 2024-02-23 MED ORDER — OXYCODONE HCL 5 MG PO TABS: 5.0000 mg | ORAL_TABLET | Freq: Four times a day (QID) | ORAL | 0 refills | Status: DC | PRN

## 2024-02-23 MED ORDER — FENTANYL CITRATE PF 50 MCG/ML IJ SOSY
50.0000 ug | PREFILLED_SYRINGE | Freq: Once | INTRAMUSCULAR | Status: AC
  Administered 2024-02-23: 50 ug via INTRAVENOUS
  Filled 2024-02-23: qty 1

## 2024-02-23 NOTE — Discharge Instructions (Addendum)
 You were seen in the emergency department today for chest pain.  As we discussed your lab work, EKG, chest x-ray all looked reassuring today.  I think that your symptoms could likely been related to muscle pain from heavy lifting.   I recommend monitoring your stress levels.  Continue to monitor how you are doing overall, and return to the emergency department for any new or worsening symptoms such as: Worsening pain or pain with exertion, difficulty breathing, sweating, or pain or swelling in your legs.  I have given you a prescription for a few doses of oxycodone  to manage her pain over the next day.  Please take this as prescribed as needed for severe breakthrough pain that does not get better with other interventions only.  I have also given you Robaxin  which is a muscle relaxer you can take as prescribed as needed.  Both of these medications can be sedating so exercise extreme caution when taking them and do not drive or operate heavy machinery.  Please call your primary provider to schedule a close follow-up appointment.  Return if development of any new or worsening symptoms.

## 2024-02-23 NOTE — ED Triage Notes (Signed)
 Chest pain left sided that started this am hurt to move left side hurts to breath she states  no injury

## 2024-02-23 NOTE — ED Provider Notes (Signed)
 [place] [facility] Provider Note   CSN: [id number] Arrival date & time: 02/23/24  1039     History  Chief Complaint  Patient presents with   Chest Pain    ESSICA Richardson is a 63 y.o. female.  Patient with history of hyperlipidemia presents today with complaints of chest pain. She states that same began this morning soon after she woke up. Pain is in her left chest and does not radiate. Pain feels like pressure and is worse with deep breathing. Denies history of similar pain previously. Pain is not reproducible to palpation. Nothing seems to make it better or worse. Denies cardiac history. She does not smoke. No nausea or vomiting. No recent travel or surgeries. No leg pain or leg swelling. No shortness of breath. Does note that she did some heavy lifting yesterday that was out of the ordinary for her. No fevers or chills, cough, or congestion.   The history is provided by the patient. No language interpreter was used.  Chest Pain      Home Medications Prior to Admission medications   Medication Sig Start Date End Date Taking? Authorizing Provider  albuterol  (VENTOLIN  HFA) 108 (90 Base) MCG/ACT inhaler Inhale 2 puffs into the lungs every 6 (six) hours as needed for wheezing or shortness of breath. Patient taking differently: Inhale 2 puffs into the lungs as needed for wheezing or shortness of breath. 11/15/22  Yes Alexander Iba, PA  atorvastatin  (LIPITOR) 20 MG tablet Take 1 tablet (20 mg total) by mouth daily. 06/20/23  Yes Alexander Iba, PA  Cholecalciferol (VITAMIN D -1000 MAX ST) 25 MCG (1000 UT) tablet TAKE ONE CAPSULE BY MOUTH DAILY 12/25/23  Yes Alexander Iba, PA  Cyanocobalamin  (B-12) 1000 MCG TABS Take 1,000 mcg by mouth daily.   Yes [provider]  estradiol (ESTRACE) 0.1 MG/GM vaginal cream Place 1 Applicatorful vaginally as needed.   Yes [provider]  fenofibrate  160 MG tablet TAKE ONE (1) TABLET BY MOUTH  EVERY DAY 06/20/23  Yes Alexander Iba, PA  FLUoxetine (PROZAC) 20 MG capsule Take 20 mg by mouth at bedtime.   Yes [provider]  lithium  carbonate 300 MG capsule Take 300 mg by mouth 2 (two) times daily with a meal. 06/29/23  Yes [provider]  meloxicam (MOBIC) 15 MG tablet Take 15 mg by mouth daily. 07/21/22  Yes [provider]  Omega-3 Fatty Acids (FISH OIL) 1000 MG CAPS Take 1 capsule by mouth daily.   Yes [provider]  telmisartan  (MICARDIS ) 20 MG tablet Take 1 tablet (20 mg total) by mouth daily. 06/20/23  Yes Alexander Iba, PA  valACYclovir  (VALTREX ) 1000 MG tablet Take 2 grams twice a day for 1 day for flare. Patient taking differently: Take 1,000 mg by mouth as needed (flares). 06/20/23  Yes Alexander Iba, PA  ondansetron  (ZOFRAN -ODT) 4 MG disintegrating tablet Take 1 tablet (4 mg total) by mouth every 8 (eight) hours as needed for nausea or vomiting. Patient not taking: Reported on 02/23/2024 02/08/24   Alexander Iba, PA  promethazine  (PHENERGAN ) 25 MG tablet Take 1 tablet (25 mg total) by mouth every 6 (six) hours as needed for nausea or vomiting. Patient not taking: Reported on 02/23/2024 02/08/24   Alexander Iba, PA      Allergies    Codeine    Review of Systems   Review of Systems  Cardiovascular:  Positive for chest pain.  All other systems reviewed and are negative.  Physical Exam Updated Vital Signs BP 123/62   Pulse (!) 58   Temp 97.6 F (36.4 C) (Oral)   Resp (!) 23   Ht 5\' 4"  (1.626 m)   Wt 85.3 kg   SpO2 96%   BMI 32.28 kg/m  Physical Exam Vitals and nursing note reviewed.  Constitutional:      General: She is not in acute distress.    Appearance: Normal appearance. She is normal weight. She is not ill-appearing, toxic-appearing or diaphoretic.  HENT:     Head: Normocephalic and atraumatic.  Cardiovascular:     Rate and Rhythm: Normal rate and regular rhythm.     Pulses:          Dorsalis pedis pulses  are 2+ on the right side and 2+ on the left side.       Posterior tibial pulses are 2+ on the right side and 2+ on the left side.     Heart sounds: Normal heart sounds.  Pulmonary:     Effort: Pulmonary effort is normal. No respiratory distress.     Breath sounds: Normal breath sounds.  Chest:     Chest wall: No tenderness.  Abdominal:     Palpations: Abdomen is soft.     Tenderness: There is no abdominal tenderness.  Musculoskeletal:        General: Normal range of motion.     Cervical back: Normal range of motion.     Right lower leg: No tenderness. No edema.     Left lower leg: No tenderness. No edema.  Skin:    General: Skin is warm and dry.  Neurological:     General: No focal deficit present.     Mental Status: She is alert.  Psychiatric:        Mood and Affect: Mood normal.        Behavior: Behavior normal.     ED Results / Procedures / Treatments   Labs (all labs ordered are listed, but only abnormal results are displayed) Labs Reviewed  BASIC METABOLIC PANEL WITH GFR  CBC  D-DIMER, QUANTITATIVE  TROPONIN I (HIGH SENSITIVITY)  TROPONIN I (HIGH SENSITIVITY)    EKG EKG Interpretation Date/Time:  Friday February 23 2024 10:50:31 EDT Ventricular Rate:  69 PR Interval:  165 QRS Duration:  99 QT Interval:  420 QTC Calculation: 450 R Axis:   22  Text Interpretation: Sinus rhythm Low voltage, precordial leads Borderline T abnormalities, anterior leads no prior ECG for comparison No STEMI Confirmed by Wynell Heath ([id number]) on 02/23/2024 10:53:01 AM  Radiology DG Chest 2 View Result Date: 02/23/2024 CLINICAL DATA:  Chest pain EXAM: CHEST - 2 VIEW COMPARISON:  None available. FINDINGS: Cardiomediastinal silhouette and pulmonary vasculature are within normal limits. Lungs are clear. Moderate bilateral acromioclavicular osteoarthrosis. IMPRESSION: No acute cardiopulmonary process. Electronically Signed   By: Elester Grim M.D.   On: 02/23/2024 14:42     Procedures Procedures    Medications Ordered in ED Medications  lidocaine  (LIDODERM ) 5 % 1 patch (1 patch Transdermal Patch Applied 02/23/24 1514)  fentaNYL  (SUBLIMAZE ) injection 50 mcg (50 mcg Intravenous Given 02/23/24 1147)  HYDROmorphone  (DILAUDID ) injection 0.5 mg (0.5 mg Intravenous Given 02/23/24 1408)  alum & mag hydroxide-simeth (MAALOX/MYLANTA) 200-200-20 MG/5ML suspension 30 mL (30 mLs Oral Given 02/23/24 1516)  methocarbamol  (ROBAXIN ) tablet 500 mg (500 mg Oral Given 02/23/24 1514)    ED Course/ Medical Decision Making/ A&P  Medical Decision Making Amount and/or Complexity of Data Reviewed Labs: ordered. Radiology: ordered.  Risk OTC drugs. Prescription drug management.   This patient is a 63 y.o. female who presents to the ED for concern of chest pain, this involves an extensive number of treatment options, and is a complaint that carries with it a high risk of complications and morbidity. The emergent differential diagnosis prior to evaluation includes, but is not limited to,  ACS, pericarditis, myocarditis, aortic dissection, PE, pneumothorax, esophageal rupture, pneumonia, reflux/PUD, biliary disease, pancreatitis, costochondritis, anxiety   This is not an exhaustive differential.   Past Medical History / Co-morbidities / Social History:  has a past medical history of Anorexia (1982), Anxiety, Arthritis, Bipolar 2 disorder (HCC), Rx Prozac and Lithium  (01/24/2019), Cardiac murmur, 2016 ECHO: LVEF 60-65%, aortic valve sclerosis, trivial TR, ASCVD 2.8% (08/03/2015), Depression, Essential hypertension, now controlled on no medication (05/31/2014), Heart murmur, History of chicken pox, Hyperlipidemia LDL goal <130, Rx Zocor and Fenofibrate  (05/31/2014), Hypertension, Irritable bowel syndrome with diarrhea (09/30/2019), Kidney stones, Primary osteoarthritis of both knees, Rx Celebrex  (11/28/2018), Recurrent major depressive disorder, in  partial remission (HCC) (05/31/2014), and Vaginal delivery (1992).  Additional history: Chart reviewed.  Physical Exam: Physical exam performed. The pertinent findings include: No acute physical exam abnormalities.  Lab Tests: I ordered, and personally interpreted labs.  The pertinent results include: No acute laboratory abnormalities. Troponin 2 --> 2. D dimer WNL   Imaging Studies: I ordered imaging studies including CXR. I independently visualized and interpreted imaging which showed NAD. I agree with the radiologist interpretation.   Cardiac Monitoring:  The patient was maintained on a cardiac monitor.  My attending physician Dr. Manus Sellers viewed and interpreted the cardiac monitored which showed an underlying rhythm of: sinus rhythm, no STEMI. I agree with this interpretation.   Medications: I ordered medication including fentanyl , dilaudid , GI cocktail, lidocaine  patch, robaxin   for pain. Reevaluation of the patient after these medicines showed that the patient improved. I have reviewed the patients home medicines and have made adjustments as needed.   Disposition: After consideration of the diagnostic results and the patients response to treatment, I feel that emergency department workup does not suggest an emergent condition requiring admission or immediate intervention beyond what has been performed at this time. The plan is: Discharge with close outpatient follow-up and return precautions.  Upon reassessment, patient is feeling better and ready to go home.  Her workup is benign.  Heart score is 2, low risk.  Her D-dimer is normal.  Therefore, doubt PE or AAA.  Her vital signs are reassuring.  Pain may be muscular in nature given her recent heavy lifting.  Discussed same with patient is understanding. Will send for a few doses of oxycodone  as well as Robaxin  for pain control.  PDMP reviewed.  Patient advised not to drive or operate heavy machinery while taking this medication. Evaluation  and diagnostic testing in the emergency department does not suggest an emergent condition requiring admission or immediate intervention beyond what has been performed at this time.  Plan for discharge with close PCP follow-up.  Patient is understanding and amenable with plan, educated on red flag symptoms that would prompt immediate return.  Patient discharged in stable condition.  Final Clinical Impression(s) / ED Diagnoses Final diagnoses:  Precordial chest pain    Rx / DC Orders ED Discharge Orders          Ordered    oxyCODONE  (ROXICODONE ) 5 MG immediate release tablet  Every 6  hours PRN        02/23/24 1626    methocarbamol  (ROBAXIN ) 500 MG tablet  2 times daily        02/23/24 1626          An After Visit Summary was printed and given to the patient.     Fredna Jasper 02/23/24 1627    Tegeler, Marine Sia, MD 02/26/24 [phone]

## 2024-02-28 NOTE — Progress Notes (Signed)
 Office Visit Note  Patient: Holly Richardson             Date of Birth: 1961-10-11           MRN: [id number]             PCP: Alexander Iba, PA Referring: Alexander Iba, PA Visit Date: 03/13/2024 Occupation: @GUAROCC @  Subjective:  Pain in multiple joints, fatigue, positive ANA  History of Present Illness: Holly Richardson is a 63 y.o. female seen today for the evaluation of polyarthralgia and positive ANA.  According the patient her symptoms started with increased fatigue last year.  She was evaluated by her PCP.  She was given B12 shots and vitamin D  for the deficiency but did not notice any improvement in her fatigue.  She was also found to have positive ANA and referred to me.  She states she remains tired despite sleeping through the night.  She has never had a sleep study.  She states she has also had joint pain for the last few years.  She complains of discomfort in her upper back, right elbow due to tennis elbow, right CMC joint and her knee joints.  She is seeing Dr. Murrell Arrant for the knee joint osteoarthritis in the past.  She had right knee joint arthroscopic surgery and debridement which helped.  She denies any history of joint swelling.  There is no history of rash.  There is no history of oral ulcers, nasal ulcers, malar rash, photosensitivity, Raynaud's or lymphadenopathy.  There is no history of inflammatory arthritis.  There is no family history of autoimmune disease except that her sister has some new onset disease which causes rash for patient.  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  She is married, right-handed, gravida 4, para 2, miscarriages 2.  There is no history of preeclampsia or DVTs.  She is non-smoker.  She has cut back on drinking alcohol and mostly drinking alcohol socially now.    Activities of Daily Living:  Patient reports morning stiffness for 30 minutes.   Patient Reports  nocturnal pain.  Difficulty dressing/grooming: Denies Difficulty climbing stairs: Denies Difficulty getting out of chair: Denies Difficulty using hands for taps, buttons, cutlery, and/or writing: Denies  Review of Systems  Constitutional:  Positive for fatigue.  HENT:  Negative for mouth sores and mouth dryness.   Eyes:  Negative for dryness.  Respiratory:  Negative for shortness of breath.   Cardiovascular:  Negative for chest pain and palpitations.  Gastrointestinal:  Positive for constipation. Negative for blood in stool and diarrhea.  Endocrine: Negative for increased urination.  Genitourinary:  Negative for involuntary urination.  Musculoskeletal:  Positive for joint pain, gait problem, joint pain, myalgias, morning stiffness and myalgias. Negative for joint swelling, muscle weakness and muscle tenderness.  Skin:  Negative for color change, rash, hair loss and sensitivity to sunlight.  Allergic/Immunologic: Positive for susceptible to infections.  Neurological:  Negative for dizziness and headaches.  Hematological:  Negative for swollen glands.  Psychiatric/Behavioral:  Positive for depressed mood and sleep disturbance. The patient is nervous/anxious.     PMFS History:  Patient Active Problem List   Diagnosis Date Noted   Irritable bowel syndrome with diarrhea 09/30/2019   Chronic pain of right knee 07/30/2019   Bipolar 2 disorder (HCC), Rx Prozac and Lithium  01/24/2019   Eating disorder in remission, Hx of anorexia as a teen,  struggles with emotional and binge eating as an adult, followed by Psych, RD; Intuitive Eating 01/24/2019   Primary osteoarthritis of both knees, Rx Celebrex  11/28/2018   Bloating 03/16/2017   Insulin resistance, diet-controlled 10/24/2016   Cardiac murmur, 2016 ECHO: LVEF 60-65%, aortic valve sclerosis, trivial TR, ASCVD 2.8% 08/03/2015   Essential hypertension, now controlled on no medication 05/31/2014   Hyperlipidemia LDL goal <130, Rx Zocor and  Fenofibrate  05/31/2014   Recurrent major depressive disorder, in partial remission (HCC) 05/31/2014    Past Medical History:  Diagnosis Date   Anorexia 1982   Anxiety    Arthritis    Bipolar 2 disorder (HCC), Rx Prozac and Lithium  01/24/2019   Cardiac murmur, 2016 ECHO: LVEF 60-65%, aortic valve sclerosis, trivial TR, ASCVD 2.8% 08/03/2015   Depression    Essential hypertension, now controlled on no medication 05/31/2014   Heart murmur    History of chicken pox    Hyperlipidemia LDL goal <130, Rx Zocor and Fenofibrate  05/31/2014   Hypertension    Irritable bowel syndrome with diarrhea 09/30/2019   Kidney stones    Primary osteoarthritis of both knees, Rx Celebrex  11/28/2018   Recurrent major depressive disorder, in partial remission (HCC) 05/31/2014   Vaginal delivery 1992    Family History  Problem Relation Age of Onset   CAD Mother    Arthritis Mother    Heart disease Mother    Hypertension Mother    Kidney disease Mother        refusing HD   Hypertension Father    Heart disease Father    Stroke Father    Healthy Sister    Heart disease Sister    Healthy Sister    Healthy Brother    Cystic fibrosis Son    Kidney Stones Son    Healthy Son    Past Surgical History:  Procedure Laterality Date   ABDOMINAL SURGERY     APPENDECTOMY     Bladder Tack     BREAST BIOPSY Left 2015   BREAST BIOPSY Right 2018   BREAST BIOPSY Right 12/21/2021   CESAREAN SECTION  1994   COLONOSCOPY  2015   Dorris Gaul, MD - [place], [place]   KNEE ARTHROSCOPY Right 03/2023   TONSILLECTOMY AND ADENOIDECTOMY     Social History   Social History Narrative   [name]   Married   2 sons -- Technical sales engineer and nursing student (cystic fibrosis)    Immunization History  Administered Date(s) Administered   Hepb-cpg 12/22/2023, 01/22/2024   Influenza,inj,Quad PF,6+ Mos 09/16/2016   Influenza-Unspecified 09/16/2016, 09/18/2017   PFIZER(Purple Top)SARS-COV-2 Vaccination  01/03/2020, 01/24/2020   Td 01/18/2019   Tdap 05/24/2010, 05/24/2010     Objective: Vital Signs: BP 126/79 (BP Location: Right Arm, Patient Position: Sitting, Cuff Size: Normal)   Pulse 73   Resp 15   Ht 5' 4.5" (1.638 m)   Wt 189 lb (85.7 kg)   BMI 31.94 kg/m    Physical Exam Vitals and nursing note reviewed.  Constitutional:      Appearance: She is well-developed.  HENT:     Head: Normocephalic and atraumatic.  Eyes:     Conjunctiva/sclera: Conjunctivae normal.  Cardiovascular:     Rate and Rhythm: Normal rate and regular rhythm.     Heart sounds: Normal heart sounds.  Pulmonary:     Effort: Pulmonary effort is normal.     Breath sounds: Normal breath sounds.  Abdominal:     General: Bowel sounds are normal.  Palpations: Abdomen is soft.  Musculoskeletal:     Cervical back: Normal range of motion.  Lymphadenopathy:     Cervical: No cervical adenopathy.  Skin:    General: Skin is warm and dry.     Capillary Refill: Capillary refill takes less than 2 seconds.  Neurological:     Mental Status: She is alert and oriented to person, place, and time.  Psychiatric:        Behavior: Behavior normal.      Musculoskeletal Exam: Cervical, thoracic and lumbar spine with good range of motion.  She describes some discomfort on the right lower rib cage.  There was no tenderness over thoracic or lumbar spine.  Shoulder joints, elbow joints, wrist joints with good range of motion.  She had tenderness over right lateral epicondyle region.  She had bilateral CMC PIP and DIP thickening with tenderness over right CMC joint.  Hip joints with good range of motion.  She had limited extension of her knee joints without any warmth swelling or effusion.  There was no tenderness over ankles or MTPs.  CDAI Exam: CDAI Score: -- Patient Global: --; Provider Global: -- Swollen: --; Tender: -- Joint Exam 03/13/2024   No joint exam has been documented for this visit   There is currently no  information documented on the homunculus. Go to the Rheumatology activity and complete the homunculus joint exam.  Investigation: No additional findings.  Imaging: DG Chest 2 View Result Date: 02/23/2024 CLINICAL DATA:  Chest pain EXAM: CHEST - 2 VIEW COMPARISON:  None available. FINDINGS: Cardiomediastinal silhouette and pulmonary vasculature are within normal limits. Lungs are clear. Moderate bilateral acromioclavicular osteoarthrosis. IMPRESSION: No acute cardiopulmonary process. Electronically Signed   By: Elester Grim M.D.   On: 02/23/2024 14:42    Recent Labs: Lab Results  Component Value Date   WBC 4.5 02/23/2024   HGB 12.4 02/23/2024   PLT 299 02/23/2024   NA 140 02/23/2024   K 4.2 02/23/2024   CL 109 02/23/2024   CO2 23 02/23/2024   GLUCOSE 95 02/23/2024   BUN 12 02/23/2024   CREATININE 0.69 02/23/2024   BILITOT 0.5 02/22/2024   ALKPHOS 54 02/22/2024   AST 18 02/22/2024   ALT 20 02/22/2024   PROT 6.5 02/22/2024   ALBUMIN 4.6 02/22/2024   CALCIUM  9.5 02/23/2024   GFRAA 89 (L) 01/02/2015    Speciality Comments: No specialty comments available.  Procedures:  No procedures performed Allergies: Codeine   Assessment / Plan:     Visit Diagnoses: Positive ANA (antinuclear antibody) - 10/26/23: ANA 1:40NH, ESR WNL, CRP WNL. 12/18/23: Actin ab-, mitochondrial M2 ab-, A-1 antitrypsin WNL, IgA WNL, ceruloplasmin WNL, hep panel negative -ANA is low titer positive which is not significant.  I did detailed discussion with the patient regarding positive ANA titer that can be seen in the normal population.  She denies any history of oral ulcers, nasal ulcers, malar rash, photosensitivity, Raynaud's, lymphadenopathy or inflammatory arthritis.  I will obtain additional labs today.  Plan: RNP Antibody, Anti-Smith antibody, Sjogrens syndrome-A extractable nuclear antibody, Sjogrens syndrome-B extractable nuclear antibody, Anti-DNA antibody, double-stranded, Urinalysis, Routine w reflex  microscopic..  I advised patient that we will consult the new patient follow-up visit.  The labs are normal and we will notify her of the results.  Lateral epicondylitis, right elbow-she has been having some discomfort in the right elbow region.  She may benefit from forearm exercises and tennis elbow brace.  Primary osteoarthritis of both hands-she had bilateral  CMC PIP and DIP thickening suggestive of osteoarthritis.  No synovitis was noted.  She had discomfort over her right CMC joint.  A prescription for right CMC brace was given.  Primary osteoarthritis of both knees-patient states she has been evaluated by Dr. Murrell Arrant in the past and had a right knee joint arthroscopic surgery and debridement.  She had limited extension of bilateral knee joints.  No warmth swelling or effusion was noted.  A handout on lower extremity exercise was given.  Detail counseled regarding osteoarthritis was provided.  Acute right-sided thoracic back pain-patient states she has been coughing recently she is uncertain if it is due to allergies or stress.  As she has been having some right rib cage pain.  A handout on thoracic exercises was given.  Elevated ferritin - 10/04/23: ferritin 384.3, 12/18/23: ferritin 363.9. 02/22/24: Ferritin WNL  Other fatigue-she has been experiencing fatigue for the last 1 year.  Patient states she has a stressful job and also has been taking of her mother who is in hospice.  She states she sleeps through the night.  Sleep study could be considered in the future if her fatigue persist.  Essential hypertension, now controlled on no medication-blood pressure is normal at 126/79.  Other medical problems are listed as follows:  Cardiac murmur, 2016 ECHO: LVEF 60-65%, aortic valve sclerosis, trivial TR, ASCVD 2.8%  Hyperlipidemia LDL goal <130, Rx Zocor and Fenofibrate   Irritable bowel syndrome with diarrhea  Insulin resistance, diet-controlled  Bipolar 2 disorder (HCC), Rx Prozac and  Lithium   Recurrent major depressive disorder, in partial remission (HCC)  Eating disorder in remission, Hx of anorexia as a teen, struggles with emotional and binge eating as an adult, followed by Psych, RD; Intuitive Eating  Orders: Orders Placed This Encounter  Procedures   RNP Antibody   Anti-Smith antibody   Sjogrens syndrome-A extractable nuclear antibody   Sjogrens syndrome-B extractable nuclear antibody   Anti-DNA antibody, double-stranded   Urinalysis, Routine w reflex microscopic   No orders of the defined types were placed in this encounter.    Follow-Up Instructions: Return for Positive ANA, joint pain.   Nicholas Bari, MD  Note - This record has been created using Animal nutritionist.  Chart creation errors have been sought, but may not always  have been located. Such creation errors do not reflect on  the standard of medical care.

## 2024-03-08 ENCOUNTER — Ambulatory Visit: Payer: 59 | Admitting: Gastroenterology

## 2024-03-13 ENCOUNTER — Ambulatory Visit: Payer: BC Managed Care – PPO | Attending: Rheumatology | Admitting: Rheumatology

## 2024-03-13 ENCOUNTER — Encounter: Payer: Self-pay | Admitting: Rheumatology

## 2024-03-13 ENCOUNTER — Telehealth: Payer: Self-pay

## 2024-03-13 VITALS — BP 126/79 | HR 73 | Resp 15 | Ht 64.5 in | Wt 189.0 lb

## 2024-03-13 DIAGNOSIS — R011 Cardiac murmur, unspecified: Secondary | ICD-10-CM

## 2024-03-13 DIAGNOSIS — F3341 Major depressive disorder, recurrent, in partial remission: Secondary | ICD-10-CM

## 2024-03-13 DIAGNOSIS — R768 Other specified abnormal immunological findings in serum: Secondary | ICD-10-CM

## 2024-03-13 DIAGNOSIS — M19041 Primary osteoarthritis, right hand: Secondary | ICD-10-CM

## 2024-03-13 DIAGNOSIS — M19042 Primary osteoarthritis, left hand: Secondary | ICD-10-CM

## 2024-03-13 DIAGNOSIS — E785 Hyperlipidemia, unspecified: Secondary | ICD-10-CM

## 2024-03-13 DIAGNOSIS — K580 Irritable bowel syndrome with diarrhea: Secondary | ICD-10-CM

## 2024-03-13 DIAGNOSIS — M170 Bilateral primary osteoarthritis of knee: Secondary | ICD-10-CM | POA: Diagnosis not present

## 2024-03-13 DIAGNOSIS — R7989 Other specified abnormal findings of blood chemistry: Secondary | ICD-10-CM

## 2024-03-13 DIAGNOSIS — F509 Eating disorder, unspecified: Secondary | ICD-10-CM

## 2024-03-13 DIAGNOSIS — E88819 Insulin resistance, unspecified: Secondary | ICD-10-CM

## 2024-03-13 DIAGNOSIS — I10 Essential (primary) hypertension: Secondary | ICD-10-CM

## 2024-03-13 DIAGNOSIS — M7711 Lateral epicondylitis, right elbow: Secondary | ICD-10-CM

## 2024-03-13 DIAGNOSIS — R5383 Other fatigue: Secondary | ICD-10-CM

## 2024-03-13 DIAGNOSIS — M546 Pain in thoracic spine: Secondary | ICD-10-CM

## 2024-03-13 DIAGNOSIS — F3181 Bipolar II disorder: Secondary | ICD-10-CM

## 2024-03-13 NOTE — Patient Instructions (Addendum)
 Osteoarthritis  Osteoarthritis is a type of arthritis. It refers to joint pain or joint disease. Osteoarthritis affects tissue that covers the ends of bones in joints (cartilage). Cartilage acts as a cushion between the bones and helps them move smoothly. Osteoarthritis occurs when cartilage in the joints gets worn down. Osteoarthritis is sometimes called "wear and tear" arthritis. Osteoarthritis is the most common form of arthritis. It often occurs in older people. It is a condition that gets worse over time. The joints most often affected by this condition are in the fingers, toes, hips, knees, and spine, including the neck and lower back. What are the causes? This condition is caused by the wearing down of cartilage that covers the ends of bones. What increases the risk? The following factors may make you more likely to develop this condition: Being age 30 or older. Obesity. Overuse of joints. Past injury of a joint. Past surgery on a joint. Family history of osteoarthritis. What are the signs or symptoms? The main symptoms of this condition are pain, swelling, and stiffness in the joint. Other symptoms may include: An enlarged joint. More pain and further damage caused by small pieces of bone or cartilage that break off and float inside of the joint. Small deposits of bone (osteophytes) that grow on the edges of the joint. A grating or scraping feeling inside the joint when you move it. Popping or creaking sounds when you move. Difficulty walking or exercising. An inability to grip items, twist your hand, or control the movements of your hands and fingers. How is this diagnosed? This condition may be diagnosed based on: Your medical history. A physical exam. Your symptoms. X-rays of the affected joints. Blood tests to rule out other types of arthritis. How is this treated? There is no cure for this condition, but treatment can help control pain and improve joint function. Treatment  may include a combination of therapies, such as: Pain relief techniques, such as: Applying heat and cold to the joint. Massage. A form of talk therapy called cognitive behavioral therapy (CBT). This therapy helps you set goals and follow up on the changes that you make. Medicines for pain and inflammation. The medicines can be taken by mouth or applied to the skin. They include: NSAIDs, such as ibuprofen. Prescription medicines. Strong anti-inflammatory medicines (corticosteroids). Certain nutritional supplements. A prescribed exercise program. You may work with a physical therapist. Assistive devices, such as a brace, wrap, splint, specialized glove, or cane. A weight control plan. Surgery, such as: An osteotomy. This is done to reposition the bones and relieve pain or to remove loose pieces of bone and cartilage. Joint replacement surgery. You may need this surgery if you have advanced osteoarthritis. Follow these instructions at home: Activity Rest your affected joints as told by your health care provider. Exercise as told by your provider. The provider may recommend specific types of exercise, such as: Strengthening exercises. These are done to strengthen the muscles that support joints affected by arthritis. Aerobic activities. These are exercises, such as brisk walking or water aerobics, that increase your heart rate. Range-of-motion activities. These help your joints move more easily. Balance and agility exercises. Managing pain, stiffness, and swelling     If told, apply heat to the affected area as often as told by your provider. Use the heat source that your provider recommends, such as a moist heat pack or a heating pad. If you have a removable assistive device, remove it as told by your provider. Place a  towel between your skin and the heat source. If your provider tells you to keep the assistive device on while you apply heat, place a towel between the assistive device and  the heat source. Leave the heat on for 20-30 minutes. If told, put ice on the affected area. If you have a removable assistive device, remove it as told by your provider. Put ice in a plastic bag. Place a towel between your skin and the bag. If your provider tells you to keep the assistive device on during icing, place a towel between the assistive device and the bag. Leave the ice on for 20 minutes, 2-3 times a day. If your skin turns bright red, remove the ice or heat right away to prevent skin damage. The risk of damage is higher if you cannot feel pain, heat, or cold. Move your fingers or toes often to reduce stiffness and swelling. Raise (elevate) the affected area above the level of your heart while you are sitting or lying down. General instructions Take over-the-counter and prescription medicines only as told by your provider. Maintain a healthy weight. Follow instructions from your provider for weight control. Do not use any products that contain nicotine or tobacco. These products include cigarettes, chewing tobacco, and vaping devices, such as e-cigarettes. If you need help quitting, ask your provider. Use assistive devices as told by your provider. Where to find more information [employer] Arthritis and Musculoskeletal and Skin Diseases: niams.http://www.myers.net/ [employer] on Aging: BaseRingTones.pl American College of Rheumatology: rheumatology.org Contact a health care provider if: You have redness, swelling, or a feeling of warmth in a joint that gets worse. You have a fever along with joint or muscle aches. You develop a rash. You have trouble doing your normal activities. You have pain that gets worse and is not relieved by pain medicine. This information is not intended to replace advice given to you by your health care provider. Make sure you discuss any questions you have with your health care provider. Document Revised: 06/23/2022 Document Reviewed:  06/23/2022 Elsevier Patient Education  2024 [employer]  Hand Exercises Hand exercises can be helpful for almost anyone. They can strengthen your hands and improve flexibility and movement. The exercises can also increase blood flow to the hands. These results can make your work and daily tasks easier for you. Hand exercises can be especially helpful for people who have joint pain from arthritis or nerve damage from using their hands over and over. These exercises can also help people who injure a hand. Exercises Most of these hand exercises are gentle stretching and motion exercises. It is usually safe to do them often throughout the day. Warming up your hands before exercise may help reduce stiffness. You can do this with gentle massage or by placing your hands in warm water for 10-15 minutes. It is normal to feel some stretching, pulling, tightness, or mild discomfort when you begin new exercises. In time, this will improve. Remember to always be careful and stop right away if you feel sudden, very bad pain or your pain gets worse. You want to get better and be safe. Ask your health care provider which exercises are safe for you. Do exercises exactly as told by your provider and adjust them as told. Do not begin these exercises until told by your provider. Knuckle bend or "claw" fist  Stand or sit with your arm, hand, and all five fingers pointed straight up. Make sure to keep your wrist straight. Gently  bend your fingers down toward your palm until the tips of your fingers are touching your palm. Keep your big knuckle straight and only bend the small knuckles in your fingers. Hold this position for 10 seconds. Straighten your fingers back to your starting position. Repeat this exercise 5-10 times with each hand. Full finger fist  Stand or sit with your arm, hand, and all five fingers pointed straight up. Make sure to keep your wrist straight. Gently bend your fingers into your palm until  the tips of your fingers are touching the middle of your palm. Hold this position for 10 seconds. Extend your fingers back to your starting position, stretching every joint fully. Repeat this exercise 5-10 times with each hand. Straight fist  Stand or sit with your arm, hand, and all five fingers pointed straight up. Make sure to keep your wrist straight. Gently bend your fingers at the big knuckle, where your fingers meet your hand, and at the middle knuckle. Keep the knuckle at the tips of your fingers straight and try to touch the bottom of your palm. Hold this position for 10 seconds. Extend your fingers back to your starting position, stretching every joint fully. Repeat this exercise 5-10 times with each hand. Tabletop  Stand or sit with your arm, hand, and all five fingers pointed straight up. Make sure to keep your wrist straight. Gently bend your fingers at the big knuckle, where your fingers meet your hand, as far down as you can. Keep the small knuckles in your fingers straight. Think of forming a tabletop with your fingers. Hold this position for 10 seconds. Extend your fingers back to your starting position, stretching every joint fully. Repeat this exercise 5-10 times with each hand. Finger spread  Place your hand flat on a table with your palm facing down. Make sure your wrist stays straight. Spread your fingers and thumb apart from each other as far as you can until you feel a gentle stretch. Hold this position for 10 seconds. Bring your fingers and thumb tight together again. Hold this position for 10 seconds. Repeat this exercise 5-10 times with each hand. Making circles  Stand or sit with your arm, hand, and all five fingers pointed straight up. Make sure to keep your wrist straight. Make a circle by touching the tip of your thumb to the tip of your index finger. Hold for 10 seconds. Then open your hand wide. Repeat this motion with your thumb and each of your  fingers. Repeat this exercise 5-10 times with each hand. Thumb motion  Sit with your forearm resting on a table and your wrist straight. Your thumb should be facing up toward the ceiling. Keep your fingers relaxed as you move your thumb. Lift your thumb up as high as you can toward the ceiling. Hold for 10 seconds. Bend your thumb across your palm as far as you can, reaching the tip of your thumb for the small finger (pinkie) side of your palm. Hold for 10 seconds. Repeat this exercise 5-10 times with each hand. Grip strengthening  Hold a stress ball or other soft ball in the middle of your hand. Slowly increase the pressure, squeezing the ball as much as you can without causing pain. Think of bringing the tips of your fingers into the middle of your palm. All of your finger joints should bend when doing this exercise. Hold your squeeze for 10 seconds, then relax. Repeat this exercise 5-10 times with each hand. Contact a health  care provider if: Your hand pain or discomfort gets much worse when you do an exercise. Your hand pain or discomfort does not improve within 2 hours after you exercise. If you have either of these problems, stop doing these exercises right away. Do not do them again unless your provider says that you can. Get help right away if: You develop sudden, severe hand pain or swelling. If this happens, stop doing these exercises right away. Do not do them again unless your provider says that you can. This information is not intended to replace advice given to you by your health care provider. Make sure you discuss any questions you have with your health care provider. Document Revised: 11/08/2022 Document Reviewed: 11/08/2022 Elsevier Patient Education  2024 [employer] Thoracic Strain Rehab Ask your health care provider which exercises are safe for you. Do exercises exactly as told by your provider and adjust them as directed. It is normal to feel mild stretching, pulling,  tightness, or discomfort as you do these exercises. Stop right away if you feel sudden pain or your pain gets worse. Do not begin these exercises until told by your provider. Stretching and range-of-motion exercise This exercise warms up your muscles and joints and improves the movement and flexibility of your back and shoulders. This exercise also helps to relieve pain. Chest and spine stretch  Lie down on your back on a firm surface. Roll a towel or a small blanket so it is about 4 inches (10 cm) in diameter. Put the towel under the middle of your back so it is under your spine, but not under your shoulder blades. Put your hands behind your head and let your elbows fall to your sides. This will increase your stretch. Take a deep breath (inhale). Hold for __________ seconds. Relax after you breathe out (exhale). Repeat __________ times. Complete this exercise __________ times a day. Strengthening exercises These exercises build strength and endurance in your back and your shoulder blade muscles. Endurance is the ability to use your muscles for a long time, even after they get tired. Alternating arm and leg raises  Get on your hands and knees on a firm surface. If you are on a hard floor, you may want to use padding, such as an exercise mat, to cushion your knees. Line up your arms and legs. Your hands should be directly below your shoulders, and your knees should be directly below your hips. Lift your left leg behind you. At the same time, raise your right arm and straighten it in front of you. Do not lift your leg higher than your hip. Do not lift your arm higher than your shoulder. Keep your abdominal and back muscles tight. Keep your hips facing the ground. Do not arch your back. Carefully stay balanced. Do not hold your breath. Hold for __________ seconds. Slowly return to the starting position and repeat with your right leg and your left arm. Repeat __________ times. Complete this  exercise __________ times a day. Straight arm rows This exercise is also called the shoulder extension exercise. Stand with your feet shoulder width apart. Secure an exercise band to a stable object in front of you so the band is at or above shoulder height. Hold one end of the exercise band in each hand. Straighten your elbows and lift your hands up to shoulder height. Step back, away from the secured end of the exercise band, until the band stretches. Squeeze your shoulder blades together and pull your hands down  to the sides of your thighs. Stop when your hands are straight down by your sides. This is shoulder extension. Do not let your hands go behind your body. Hold for __________ seconds. Slowly return to the starting position. Repeat __________ times. Complete this exercise __________ times a day. Rowing scapular retraction This is an exercise in which the shoulder blades (scapulae) are pulled toward each other (retraction). Sit in a stable chair without armrests, or stand up. Secure an exercise band to a stable object in front of you so the band is at shoulder height. Hold one end of the exercise band in each hand. Your palms should face toward each other. Bring your arms out straight in front of you. Step back, away from the secured end of the exercise band, until the band stretches. Pull the band backward. As you do this, bend your elbows and squeeze your shoulder blades together, but avoid letting the rest of your body move. Do not shrug your shoulders upward while you do this. Stop when your elbows are at your sides or slightly behind your body. Hold for __________ seconds. Slowly straighten your arms to return to the starting position. Repeat __________ times. Complete this exercise __________ times a day. Posture and body mechanics Good posture and healthy body mechanics can help to relieve stress in your body's tissues and joints. Body mechanics refers to the movements and  positions of your body while you do your daily activities. Posture is part of body mechanics. Good posture means: Your spine is in its natural S-curve position (neutral). Your shoulders are pulled back slightly. Your head is not tipped forward. Follow these guidelines to improve your posture and body mechanics in your everyday activities. Standing  When standing, keep your spine neutral and your feet about hip width apart. Keep a slight bend in your knees. Your ears, shoulders, and hips should line up with each other. When you do a task in which you lean forward while standing in one place for a long time, place one foot up on a stable object that is 2-4 inches (5-10 cm) high, such as a footstool. This helps keep your spine neutral. Sitting  When sitting, keep your spine neutral and keep your feet flat on the floor. Use a footrest if needed. Keep your thighs parallel to the floor. Avoid rounding your shoulders, and avoid tilting your head forward. When working at a desk or a computer, keep your desk at a height where your hands are slightly lower than your elbows. Slide your chair under your desk so you are close enough to maintain good posture. When working at a computer, place your monitor at a height where you are looking straight ahead and you do not have to tilt your head forward or downward to look at the screen. Resting When lying down and resting, avoid positions that are most painful for you. If you have pain with activities such as sitting, bending, stooping, or squatting (flexion-basedactivities), lie in a position in which your body does not bend very much. For example, avoid curling up on your side with your arms and knees near your chest (fetal position). If you have pain with activities such as standing for a long time or reaching with your arms (extension-basedactivities), lie with your spine in a neutral position and bend your knees slightly. Try the following positions: Lie on  your side with a pillow between your knees. Lie on your back with a pillow under your knees.  Lifting  When lifting objects, keep your feet at least shoulder width apart and tighten your abdominal muscles. Bend your knees and hips and keep your spine neutral. It is important to lift using the strength of your legs, not your back. Do not lock your knees straight out. Always ask for help to lift heavy or awkward objects. This information is not intended to replace advice given to you by your health care provider. Make sure you discuss any questions you have with your health care provider. Document Revised: 04/07/2023 Document Reviewed: 06/13/2022 Elsevier Patient Education  2024 [employer] Exercises for Chronic Knee Pain Chronic knee pain is pain that lasts longer than 3 months. For most people with chronic knee pain, exercise and weight loss is an important part of treatment. Your health care provider may want you to focus on: Making the muscles that support your knee stronger. This can take pressure off your knee and reduce pain. Preventing knee stiffness. How far you can move your knee, keeping it there or making it farther. Losing weight (if this applies) to take pressure off your knee, lower your risk for injury, and make it easier for you to exercise. Your provider will help you make an exercise program that fits your needs and physical abilities. Below are simple, low-impact exercises you can do at home. Ask your provider or physical therapist how often you should do your exercise program and how many times to repeat each exercise. General safety tips  Get your provider's approval before doing any exercises. Start slowly and stop any time you feel pain. Do not exercise if your knee pain is flaring up. Warm up first. Stretching a cold muscle can cause an injury. Do 5-10 minutes of easy movement or light stretching before beginning your exercises. Do 5-10 minutes of low-impact activity  (like walking or cycling) before starting strengthening exercises. Contact your provider any time you have pain during or after exercising. Exercise can cause discomfort but should not be painful. It is normal to be a little stiff or sore after exercising. Stretching and range-of-motion exercises Front thigh stretch  Stand up straight and support your body by holding on to a chair or resting one hand on a wall. With your legs straight and close together, bend one knee to lift your heel up toward your butt. Using one hand for support, grab your ankle with your free hand. Pull your foot up closer toward your butt to feel the stretch in front of your thigh. Hold the stretch for 30 seconds. Repeat __________ times. Complete this exercise __________ times a day. Back thigh stretch  Sit on the floor with your back straight and your legs out straight in front of you. Place the palms of your hands on the floor and slide them toward your feet as you bend at the hip. Try to touch your nose to your knees and feel the stretch in the back of your thighs. Hold for 30 seconds. Repeat __________ times. Complete this exercise __________ times a day. Calf stretch  Stand facing a wall. Place the palms of your hands flat against the wall, arms extended, and lean slightly against the wall. Get into a lunge position with one leg bent at the knee and the other leg stretched out straight behind you. Keep both feet facing the wall and increase the bend in your knee while keeping the heel of the other leg flat on the ground. You should feel the stretch in your calf. Hold for 30 seconds.  Repeat __________ times. Complete this exercise __________ times a day. Strengthening exercises Straight leg lift  Lie on your back with one knee bent and the other leg out straight. Slowly lift the straight leg without bending the knee. Lift until your foot is about 12 inches (30 cm) off the floor. Hold for 3-5 seconds and  slowly lower your leg. Repeat __________ times. Complete this exercise __________ times a day. Single leg dip  Stand between two chairs and put both hands on the backs of the chairs for support. Extend one leg out straight with your body weight resting on the heel of the standing leg. Slowly bend your standing knee to dip your body to the level that is comfortable for you. Hold for 3-5 seconds. Repeat __________ times. Complete this exercise __________ times a day. Hamstring curls  Stand straight, knees close together, facing the back of a chair. Hold on to the back of a chair with both hands. Keep one leg straight. Bend the other knee while bringing the heel up toward the butt until the knee is bent at a 90-degree angle (right angle). Hold for 3-5 seconds. Repeat __________ times. Complete this exercise __________ times a day. Wall squat  Stand straight with your back, hips, and head against a wall. Step forward one foot at a time with your back still against the wall. Your feet should be 2 feet (61 cm) from the wall at shoulder width. Keeping your back, hips, and head against the wall, slide down the wall to as close to a sitting position as you can get. Hold for 5-10 seconds, then slowly slide back up. Repeat __________ times. Complete this exercise __________ times a day. Step-ups  Stand in front of a sturdy platform or stool that is about 6 inches (15 cm) high. Slowly step up with your left / right foot, keeping your knee in line with your hip and foot. Do not let your knee bend so far that you cannot see your toes. Hold on to a chair for balance, but do not use it for support. Slowly unlock your knee and lower yourself to the starting position. Repeat __________ times. Complete this exercise __________ times a day. Contact a health care provider if: Your exercises cause pain. Your pain is worse after you exercise. Your pain prevents you from doing your exercises. This  information is not intended to replace advice given to you by your health care provider. Make sure you discuss any questions you have with your health care provider. Document Revised: 11/08/2022 Document Reviewed: 11/08/2022 Elsevier Patient Education  2024 [employer].

## 2024-03-13 NOTE — Telephone Encounter (Signed)
 Patient was seen as a new patient today, 03/13/2024.  If lab results are normal, patient can be called with results and a follow up is not needed, per Dr. Alvira Josephs. Thanks!

## 2024-03-14 ENCOUNTER — Encounter: Payer: Self-pay | Admitting: Physician Assistant

## 2024-03-14 LAB — URINALYSIS, ROUTINE W REFLEX MICROSCOPIC
Bilirubin Urine: NEGATIVE
Glucose, UA: NEGATIVE
Hgb urine dipstick: NEGATIVE
Hyaline Cast: NONE SEEN /LPF
Ketones, ur: NEGATIVE
Nitrite: POSITIVE — AB
Protein, ur: NEGATIVE
RBC / HPF: NONE SEEN /HPF (ref 0–2)
Specific Gravity, Urine: 1.015 (ref 1.001–1.035)
pH: 5.5 (ref 5.0–8.0)

## 2024-03-14 LAB — ANTI-DNA ANTIBODY, DOUBLE-STRANDED: ds DNA Ab: <1 [IU]/mL

## 2024-03-14 LAB — RNP ANTIBODY: Ribonucleic Protein(ENA) Antibody, IgG: <1 AI

## 2024-03-14 LAB — ANTI-SMITH ANTIBODY: ENA SM Ab Ser-aCnc: <1 AI

## 2024-03-14 LAB — MICROSCOPIC MESSAGE

## 2024-03-14 LAB — SJOGRENS SYNDROME-A EXTRACTABLE NUCLEAR ANTIBODY: SSA (Ro) (ENA) Antibody, IgG: <1 AI

## 2024-03-14 LAB — SJOGRENS SYNDROME-B EXTRACTABLE NUCLEAR ANTIBODY: SSB (La) (ENA) Antibody, IgG: <1 AI

## 2024-03-14 NOTE — Progress Notes (Signed)
 UA shows positive nitrite and many bacteria consistent with urinary tract infection.  Please forward results to patient's PCP and have patient contact their office for the treatment of UTI.

## 2024-03-15 ENCOUNTER — Encounter: Payer: Self-pay | Admitting: Physician Assistant

## 2024-03-15 ENCOUNTER — Ambulatory Visit
Admission: RE | Admit: 2024-03-15 | Discharge: 2024-03-15 | Disposition: A | Discharge status: Home / Self Care | Source: Ambulatory Visit | Attending: Obstetrics & Gynecology | Admitting: Obstetrics & Gynecology

## 2024-03-15 ENCOUNTER — Ambulatory Visit (INDEPENDENT_AMBULATORY_CARE_PROVIDER_SITE_OTHER): Admitting: Physician Assistant

## 2024-03-15 VITALS — BP 122/79 | HR 73 | Temp 97.2°F | Ht 64.5 in

## 2024-03-15 DIAGNOSIS — R8290 Unspecified abnormal findings in urine: Secondary | ICD-10-CM | POA: Diagnosis not present

## 2024-03-15 DIAGNOSIS — R051 Acute cough: Secondary | ICD-10-CM

## 2024-03-15 DIAGNOSIS — Z1231 Encounter for screening mammogram for malignant neoplasm of breast: Secondary | ICD-10-CM

## 2024-03-15 MED ORDER — AMOXICILLIN-POT CLAVULANATE 875-125 MG PO TABS: 1.0000 | ORAL_TABLET | Freq: Two times a day (BID) | ORAL | 0 refills | Status: DC

## 2024-03-15 NOTE — Progress Notes (Signed)
 Holly Richardson is a 63 y.o. female here for a new problem.  History of Present Illness:   Chief Complaint  Patient presents with   Pelvic Pain    Pt c/o pelvic pains, Present for 1 month.    Cough    Pelvic Pain  Patient complains of lower pelvic pain that has persisted for the past month Associated symptoms include right lower back pain, urinary leakage, frequency,  Most recent urine analysis was abnormal when she had urinalysis screening at rheumatology - was told to follow up with us  for this abnormal result Denies any vaginal discharge, diarrhea, concern for sexual transmitted infection.   Cough  Patient also complains of a cough that has persisted for the past few weeks. Continues to expectorate green/brown sputum. Reports taking Tessalon Pearls but states they have not been helping relief any of her symptoms.  Also has been taking mucinex but states it only drys her sinuses not her chest.   She states that she has been experiencing immense stress and has been taking care of their elder parents. Believes she could have pulled a muscle while taking care of them. She was also seen in the ED few weeks prior for chest pain. X-rays were clear.  Past Medical History:  Diagnosis Date   Anorexia 1982   Anxiety    Arthritis    Bipolar 2 disorder (HCC), Rx Prozac and Lithium  01/24/2019   Cardiac murmur, 2016 ECHO: LVEF 60-65%, aortic valve sclerosis, trivial TR, ASCVD 2.8% 08/03/2015   Depression    Essential hypertension, now controlled on no medication 05/31/2014   Heart murmur    History of chicken pox    Hyperlipidemia LDL goal <130, Rx Zocor and Fenofibrate  05/31/2014   Hypertension    Irritable bowel syndrome with diarrhea 09/30/2019   Kidney stones    Primary osteoarthritis of both knees, Rx Celebrex  11/28/2018   Recurrent major depressive disorder, in partial remission (HCC) 05/31/2014   Vaginal delivery 1992     Social History   Tobacco Use   Smoking status: Never     Passive exposure: Never   Smokeless tobacco: Never  Vaping Use   Vaping status: Never Used  Substance Use Topics   Alcohol use: Yes    Comment: occ   Drug use: No    Past Surgical History:  Procedure Laterality Date   ABDOMINAL SURGERY     APPENDECTOMY     Bladder Tack     BREAST BIOPSY Left 2015   BREAST BIOPSY Right 2018   BREAST BIOPSY Right 12/21/2021   CESAREAN SECTION  1994   COLONOSCOPY  2015   Dorris Gaul, MD - [place], [place]   KNEE ARTHROSCOPY Right 03/2023   TONSILLECTOMY AND ADENOIDECTOMY      Family History  Problem Relation Age of Onset   CAD Mother    Arthritis Mother    Heart disease Mother    Hypertension Mother    Kidney disease Mother        refusing HD   Hypertension Father    Heart disease Father    Stroke Father    Healthy Sister    Heart disease Sister    Healthy Sister    Healthy Brother    Cystic fibrosis Son    Kidney Stones Son    Healthy Son     Allergies  Allergen Reactions   Codeine Other (See Comments)    Headache    Current Medications:   Current Outpatient Medications:  albuterol  (VENTOLIN  HFA) 108 (90 Base) MCG/ACT inhaler, Inhale 2 puffs into the lungs every 6 (six) hours as needed for wheezing or shortness of breath. (Patient taking differently: Inhale 2 puffs into the lungs as needed for wheezing or shortness of breath.), Disp: 8 g, Rfl: 0   amoxicillin -clavulanate (AUGMENTIN ) 875-125 MG tablet, Take 1 tablet by mouth 2 (two) times daily., Disp: 14 tablet, Rfl: 0   atorvastatin  (LIPITOR) 20 MG tablet, Take 1 tablet (20 mg total) by mouth daily., Disp: 90 tablet, Rfl: 3   Cholecalciferol (VITAMIN D -1000 MAX ST) 25 MCG (1000 UT) tablet, TAKE ONE CAPSULE BY MOUTH DAILY, Disp: 90 tablet, Rfl: 0   Cyanocobalamin  (B-12) 1000 MCG TABS, Take 1,000 mcg by mouth daily., Disp: , Rfl:    fenofibrate  160 MG tablet, TAKE ONE (1) TABLET BY MOUTH EVERY DAY, Disp: 90 tablet, Rfl: 3   FLUoxetine (PROZAC) 20 MG capsule,  Take 20 mg by mouth at bedtime., Disp: , Rfl:    lithium  carbonate 300 MG capsule, Take 300 mg by mouth 2 (two) times daily with a meal., Disp: , Rfl:    meloxicam (MOBIC) 15 MG tablet, Take 15 mg by mouth daily., Disp: , Rfl:    Omega-3 Fatty Acids (FISH OIL) 1000 MG CAPS, Take 1 capsule by mouth daily., Disp: , Rfl:    ondansetron  (ZOFRAN -ODT) 4 MG disintegrating tablet, Take 1 tablet (4 mg total) by mouth every 8 (eight) hours as needed for nausea or vomiting., Disp: 30 tablet, Rfl: 1   oxyCODONE  (ROXICODONE ) 5 MG immediate release tablet, Take 1 tablet (5 mg total) by mouth every 6 (six) hours as needed for severe pain (pain score 7-10)., Disp: 4 tablet, Rfl: 0   promethazine  (PHENERGAN ) 25 MG tablet, Take 1 tablet (25 mg total) by mouth every 6 (six) hours as needed for nausea or vomiting., Disp: 30 tablet, Rfl: 0   telmisartan  (MICARDIS ) 20 MG tablet, Take 1 tablet (20 mg total) by mouth daily., Disp: 90 tablet, Rfl: 3   valACYclovir  (VALTREX ) 1000 MG tablet, Take 2 grams twice a day for 1 day for flare. (Patient taking differently: Take 1,000 mg by mouth as needed (flares).), Disp: 30 tablet, Rfl: 1   estradiol (ESTRACE) 0.1 MG/GM vaginal cream, Place 1 Applicatorful vaginally as needed. (Patient not taking: Reported on 03/15/2024), Disp: , Rfl:    methocarbamol  (ROBAXIN ) 500 MG tablet, Take 1 tablet (500 mg total) by mouth 2 (two) times daily. (Patient not taking: Reported on 03/15/2024), Disp: 20 tablet, Rfl: 0   Review of Systems:   Review of Systems  HENT:  Positive for sinus pain.   Respiratory:  Positive for cough.   Genitourinary:  Positive for pelvic pain.  Musculoskeletal:  Positive for back pain.    Vitals:   Vitals:   03/15/24 0835  BP: 122/79  Pulse: 73  Temp: (!) 97.2 F (36.2 C)  Height: 5' 4.5" (1.638 m)     Body mass index is 31.94 kg/m.  Physical Exam:   Physical Exam Vitals and nursing note reviewed.  Constitutional:      General: She is not in acute  distress.    Appearance: She is well-developed. She is not ill-appearing or toxic-appearing.  HENT:     Head: Normocephalic and atraumatic.     Right Ear: Tympanic membrane, ear canal and external ear normal. Tympanic membrane is not erythematous, retracted or bulging.     Left Ear: Tympanic membrane, ear canal and external ear normal. Tympanic membrane is  not erythematous, retracted or bulging.     Nose: Nose normal.     Right Sinus: No maxillary sinus tenderness or frontal sinus tenderness.     Left Sinus: No maxillary sinus tenderness or frontal sinus tenderness.     Mouth/Throat:     Pharynx: Uvula midline. No posterior oropharyngeal erythema.  Eyes:     General: Lids are normal.     Conjunctiva/sclera: Conjunctivae normal.  Neck:     Trachea: Trachea normal.  Cardiovascular:     Rate and Rhythm: Normal rate and regular rhythm.     Heart sounds: Normal heart sounds, S1 normal and S2 normal.  Pulmonary:     Effort: Pulmonary effort is normal.     Breath sounds: Normal breath sounds. No decreased breath sounds, wheezing, rhonchi or rales.  Lymphadenopathy:     Cervical: No cervical adenopathy.  Skin:    General: Skin is warm and dry.  Neurological:     Mental Status: She is alert.  Psychiatric:        Speech: Speech normal.        Behavior: Behavior normal. Behavior is cooperative.     Assessment and Plan:   Abnormal urinalysis No red flags on exam Will repeat send out UA and add culture Will go ahead and start empiric treatment(s) with Augmentin  for this issue, as well as issue below Push fluids If new/worsening symptom(s), reach out  Acute cough No red flags on exam.   Will initiate augmentin  per orders.  Discussed taking medications as prescribed.  Reviewed return precautions including new or worsening fever, SOB, new or worsening cough or other concerns.  Push fluids and rest.  I recommend that patient follow-up if symptoms worsen or persist despite treatment x  7-10 days, sooner if needed.   Alexander Iba, PA-C   I,Safa M Kadhim,acting as a scribe for Alexander Iba, PA.,have documented all relevant documentation on the behalf of Alexander Iba, PA,as directed by  Alexander Iba, PA while in the presence of Alexander Iba, [place].   I, Alexander Iba, [place], have reviewed all documentation for this visit. The documentation on 03/15/24 for the exam, diagnosis, procedures, and orders are all accurate and complete.

## 2024-03-15 NOTE — Telephone Encounter (Signed)
 Patient advised of lab results and new patient follow up appointment has been cancelled.

## 2024-03-15 NOTE — Progress Notes (Signed)
 ENA panel negative.

## 2024-03-15 NOTE — Telephone Encounter (Signed)
 Yes, please notify patient that all the serology was negative.  We may cancel new patient follow-up appointment.

## 2024-03-17 LAB — URINE CULTURE
MICRO NUMBER:: 16436284
SPECIMEN QUALITY:: ADEQUATE

## 2024-03-18 ENCOUNTER — Encounter: Payer: Self-pay | Admitting: Physician Assistant

## 2024-03-25 ENCOUNTER — Encounter: Payer: Self-pay | Admitting: Physician Assistant

## 2024-03-26 ENCOUNTER — Other Ambulatory Visit (INDEPENDENT_AMBULATORY_CARE_PROVIDER_SITE_OTHER)

## 2024-03-26 ENCOUNTER — Other Ambulatory Visit: Payer: Self-pay | Admitting: Physician Assistant

## 2024-03-26 DIAGNOSIS — R8290 Unspecified abnormal findings in urine: Secondary | ICD-10-CM

## 2024-03-27 ENCOUNTER — Encounter: Payer: Self-pay | Admitting: Physician Assistant

## 2024-03-27 ENCOUNTER — Ambulatory Visit: Admitting: Physician Assistant

## 2024-03-27 ENCOUNTER — Ambulatory Visit: Payer: Self-pay | Admitting: Physician Assistant

## 2024-03-27 VITALS — BP 138/80 | HR 65 | Temp 97.3°F | Ht 64.5 in

## 2024-03-27 DIAGNOSIS — R109 Unspecified abdominal pain: Secondary | ICD-10-CM | POA: Diagnosis not present

## 2024-03-27 LAB — URINALYSIS, ROUTINE W REFLEX MICROSCOPIC
Bilirubin Urine: NEGATIVE
Hgb urine dipstick: NEGATIVE
Ketones, ur: NEGATIVE
Leukocytes,Ua: NEGATIVE
Nitrite: NEGATIVE
RBC / HPF: NONE SEEN (ref 0–?)
Specific Gravity, Urine: 1.015 (ref 1.000–1.030)
Total Protein, Urine: NEGATIVE
Urine Glucose: NEGATIVE
Urobilinogen, UA: 0.2 (ref 0.0–1.0)
pH: 7 (ref 5.0–8.0)

## 2024-03-27 MED ORDER — CEFTRIAXONE SODIUM 1 G IJ SOLR
1.0000 g | Freq: Once | INTRAMUSCULAR | Status: AC
  Administered 2024-03-27: 1 g via INTRAMUSCULAR

## 2024-03-27 MED ORDER — TAMSULOSIN HCL 0.4 MG PO CAPS: 0.4000 mg | ORAL_CAPSULE | Freq: Every day | ORAL | 0 refills | Status: DC

## 2024-03-27 MED ORDER — CIPROFLOXACIN HCL 500 MG PO TABS: 500.0000 mg | ORAL_TABLET | Freq: Two times a day (BID) | ORAL | 0 refills | Status: AC

## 2024-03-27 NOTE — Progress Notes (Signed)
 Holly Richardson is a 63 y.o. female here for a recurrence of a previously resolved problem.  History of Present Illness:   Chief Complaint  Patient presents with   Flank Pain    Pt c/o right flank pain increase in pain. Denies fever or chills.   Flank pain: Pt reports worsening right flank pain with associated lower abdominal muscle spasms.  Described her flank pain as a "bruise inside" and states her muscle spasms feel like a "jerk" sensation when sitting down.  Pt had a urine culture collected on 5/9 which was positive for infection.  She was compliant with a full course of Augmentin  as prescribed and her symptoms did improve.  She messaged us  yesterday for recurrence of symptom(s) and she came for urinalysis. This shows improvement from prior urinalysis but urine culture is pending.  Pt has a h/o renal stones and passed them all without need for surgical/laser Her current pain is not similar to her typical pain with kidney stones.  She has taken Flomax  in the past, which did help.  Denies any fever, chills, hematuria, chest pain, or upper abdominal pain.  Past Medical History:  Diagnosis Date   Anorexia 1982   Anxiety    Arthritis    Bipolar 2 disorder (HCC), Rx Prozac and Lithium  01/24/2019   Cardiac murmur, 2016 ECHO: LVEF 60-65%, aortic valve sclerosis, trivial TR, ASCVD 2.8% 08/03/2015   Depression    Essential hypertension, now controlled on no medication 05/31/2014   Heart murmur    History of chicken pox    Hyperlipidemia LDL goal <130, Rx Zocor and Fenofibrate  05/31/2014   Hypertension    Irritable bowel syndrome with diarrhea 09/30/2019   Kidney stones    Primary osteoarthritis of both knees, Rx Celebrex  11/28/2018   Recurrent major depressive disorder, in partial remission (HCC) 05/31/2014   Vaginal delivery 1992     Social History   Tobacco Use   Smoking status: Never    Passive exposure: Never   Smokeless tobacco: Never  Vaping Use   Vaping status:  Never Used  Substance Use Topics   Alcohol use: Yes    Comment: occ   Drug use: No    Past Surgical History:  Procedure Laterality Date   ABDOMINAL SURGERY     APPENDECTOMY     Bladder Tack     BREAST BIOPSY Left 2015   BREAST BIOPSY Right 2018   BREAST BIOPSY Right 12/21/2021   CESAREAN SECTION  1994   COLONOSCOPY  2015   [place], Jeananne Mighty, MD - [place], [place]   KNEE ARTHROSCOPY Right 03/2023   TONSILLECTOMY AND ADENOIDECTOMY      Family History  Problem Relation Age of Onset   CAD Mother    Arthritis Mother    Heart disease Mother    Hypertension Mother    Kidney disease Mother        refusing HD   Hypertension Father    Heart disease Father    Stroke Father    Healthy Sister    Heart disease Sister    Healthy Sister    Healthy Brother    Cystic fibrosis Son    Kidney Stones Son    Healthy Son     Allergies  Allergen Reactions   Codeine Other (See Comments)    Headache    Current Medications:   Current Outpatient Medications:    albuterol  (VENTOLIN  HFA) 108 (90 Base) MCG/ACT inhaler, Inhale 2 puffs into the lungs every 6 (  six) hours as needed for wheezing or shortness of breath. (Patient taking differently: Inhale 2 puffs into the lungs as needed for wheezing or shortness of breath.), Disp: 8 g, Rfl: 0   atorvastatin  (LIPITOR) 20 MG tablet, Take 1 tablet (20 mg total) by mouth daily., Disp: 90 tablet, Rfl: 3   Cholecalciferol (VITAMIN D -1000 MAX ST) 25 MCG (1000 UT) tablet, TAKE ONE CAPSULE BY MOUTH DAILY, Disp: 90 tablet, Rfl: 0   ciprofloxacin (CIPRO) 500 MG tablet, Take 1 tablet (500 mg total) by mouth 2 (two) times daily for 7 days., Disp: 14 tablet, Rfl: 0   Cyanocobalamin  (B-12) 1000 MCG TABS, Take 1,000 mcg by mouth daily., Disp: , Rfl:    estradiol (ESTRACE) 0.1 MG/GM vaginal cream, Place 1 Applicatorful vaginally as needed., Disp: , Rfl:    fenofibrate  160 MG tablet, TAKE ONE (1) TABLET BY MOUTH EVERY DAY, Disp: 90 tablet, Rfl: 3    FLUoxetine (PROZAC) 20 MG capsule, Take 20 mg by mouth at bedtime., Disp: , Rfl:    lithium  carbonate 300 MG capsule, Take 300 mg by mouth 2 (two) times daily with a meal., Disp: , Rfl:    meloxicam (MOBIC) 15 MG tablet, Take 15 mg by mouth daily., Disp: , Rfl:    Omega-3 Fatty Acids (FISH OIL) 1000 MG CAPS, Take 1 capsule by mouth daily., Disp: , Rfl:    ondansetron  (ZOFRAN -ODT) 4 MG disintegrating tablet, Take 1 tablet (4 mg total) by mouth every 8 (eight) hours as needed for nausea or vomiting., Disp: 30 tablet, Rfl: 1   promethazine  (PHENERGAN ) 25 MG tablet, Take 1 tablet (25 mg total) by mouth every 6 (six) hours as needed for nausea or vomiting., Disp: 30 tablet, Rfl: 0   tamsulosin  (FLOMAX ) 0.4 MG CAPS capsule, Take 1 capsule (0.4 mg total) by mouth daily., Disp: 30 capsule, Rfl: 0   telmisartan  (MICARDIS ) 20 MG tablet, Take 1 tablet (20 mg total) by mouth daily., Disp: 90 tablet, Rfl: 3   valACYclovir  (VALTREX ) 1000 MG tablet, Take 2 grams twice a day for 1 day for flare. (Patient taking differently: Take 1,000 mg by mouth as needed (flares).), Disp: 30 tablet, Rfl: 1   Review of Systems:   Negative unless otherwise specified per HPI.  Vitals:   Vitals:   03/27/24 1512  BP: 138/80  Pulse: 65  Temp: (!) 97.3 F (36.3 C)  TempSrc: Temporal  SpO2: 97%  Height: 5' 4.5" (1.638 m)     Body mass index is 31.94 kg/m.  Physical Exam:   Physical Exam Vitals and nursing note reviewed.  Constitutional:      General: She is not in acute distress.    Appearance: She is well-developed. She is not ill-appearing or toxic-appearing.  Cardiovascular:     Rate and Rhythm: Normal rate and regular rhythm.     Pulses: Normal pulses.     Heart sounds: Normal heart sounds, S1 normal and S2 normal.  Pulmonary:     Effort: Pulmonary effort is normal.     Breath sounds: Normal breath sounds.  Abdominal:     Tenderness: There is right CVA tenderness.  Skin:    General: Skin is warm and dry.   Neurological:     Mental Status: She is alert.     GCS: GCS eye subscore is 4. GCS verbal subscore is 5. GCS motor subscore is 6.  Psychiatric:        Speech: Speech normal.        Behavior:  Behavior normal. Behavior is cooperative.     Assessment and Plan:   1. Flank pain (Primary) - CBC with Differential/Platelet - Basic Metabolic Panel (BMET) - cefTRIAXone (ROCEPHIN) injection 1 g   Vitals stable; no confirms for systemic illness at this time Differential diagnosis includes but is not limited to pyelonephritis, nephrolithiasis, musculoskeletal strain  Administered 1 gram rocephin injection Start ciprofloxacin to cover for pyelonephritis; Flomax  to help with stone passage Considered Toradol  but we are awaiting kidney function results Stat CBC and CMP Await culture results and modify/add treatment(s) as indicated If any worsening symptom(s), she was advised to go to the emergency room Offered CT renal stone however she declined  I, Bernita Bristle, acting as a Neurosurgeon for [employer], [place]., have documented all relevant documentation on the behalf of Alexander Iba, [place], as directed by   while in the presence of Alexander Iba, [place].  I, Alexander Iba, [place], have reviewed all documentation for this visit. The documentation on 03/27/24 for the exam, diagnosis, procedures, and orders are all accurate and complete.  Alexander Iba, PA-C

## 2024-03-27 NOTE — Patient Instructions (Signed)
 It was great to see you!  Ceftriaxone/Rocephin injection given today Start oral ciprofloxacin antibiotic(s) when you pick it up  Start Flomax  daily to help facilitate possible stone passage  We will be in touch with urine culture results and blood work  If ANY WORSENING, go to the ER  Take care,  Alexander Iba PA-C

## 2024-03-28 LAB — CBC WITH DIFFERENTIAL/PLATELET
Basophils Absolute: 0.1 10*3/uL (ref 0.0–0.1)
Basophils Relative: 1.1 % (ref 0.0–3.0)
Eosinophils Absolute: 0.1 10*3/uL (ref 0.0–0.7)
Eosinophils Relative: 2 % (ref 0.0–5.0)
HCT: 35.3 % — ABNORMAL LOW (ref 36.0–46.0)
Hemoglobin: 11.7 g/dL — ABNORMAL LOW (ref 12.0–15.0)
Lymphocytes Relative: 35.1 % (ref 12.0–46.0)
Lymphs Abs: 1.9 10*3/uL (ref 0.7–4.0)
MCHC: 33.3 g/dL (ref 30.0–36.0)
MCV: 90 fl (ref 78.0–100.0)
Monocytes Absolute: 0.6 10*3/uL (ref 0.1–1.0)
Monocytes Relative: 10.8 % (ref 3.0–12.0)
Neutro Abs: 2.8 10*3/uL (ref 1.4–7.7)
Neutrophils Relative %: 51 % (ref 43.0–77.0)
Platelets: 303 10*3/uL (ref 150.0–400.0)
RBC: 3.92 Mil/uL (ref 3.87–5.11)
RDW: 13.6 % (ref 11.5–15.5)
WBC: 5.5 10*3/uL (ref 4.0–10.5)

## 2024-03-28 LAB — BASIC METABOLIC PANEL WITH GFR
BUN: 17 mg/dL (ref 6–23)
CO2: 24 meq/L (ref 19–32)
Calcium: 9.7 mg/dL (ref 8.4–10.5)
Chloride: 107 meq/L (ref 96–112)
Creatinine, Ser: 0.7 mg/dL (ref 0.40–1.20)
GFR: 93.92 mL/min (ref >60.00)
Glucose, Bld: 96 mg/dL (ref 70–99)
Potassium: 4.4 meq/L (ref 3.5–5.1)
Sodium: 139 meq/L (ref 135–145)

## 2024-03-29 ENCOUNTER — Ambulatory Visit: Payer: Self-pay | Admitting: Physician Assistant

## 2024-03-29 DIAGNOSIS — R710 Precipitous drop in hematocrit: Secondary | ICD-10-CM

## 2024-03-29 LAB — URINE CULTURE
MICRO NUMBER:: 16478823
SPECIMEN QUALITY:: ADEQUATE

## 2024-04-01 ENCOUNTER — Encounter: Payer: Self-pay | Admitting: Physician Assistant

## 2024-04-02 ENCOUNTER — Other Ambulatory Visit: Payer: Self-pay | Admitting: Physician Assistant

## 2024-04-02 ENCOUNTER — Ambulatory Visit (HOSPITAL_COMMUNITY)
Admission: RE | Admit: 2024-04-02 | Discharge: 2024-04-02 | Disposition: A | Discharge status: Home / Self Care | Source: Ambulatory Visit | Attending: Physician Assistant | Admitting: Physician Assistant

## 2024-04-02 DIAGNOSIS — R1084 Generalized abdominal pain: Secondary | ICD-10-CM | POA: Diagnosis present

## 2024-04-03 ENCOUNTER — Other Ambulatory Visit: Payer: Self-pay | Admitting: Physician Assistant

## 2024-04-03 ENCOUNTER — Ambulatory Visit: Payer: Self-pay | Admitting: Physician Assistant

## 2024-04-03 ENCOUNTER — Encounter: Payer: Self-pay | Admitting: Physician Assistant

## 2024-04-03 DIAGNOSIS — R109 Unspecified abdominal pain: Secondary | ICD-10-CM

## 2024-04-05 ENCOUNTER — Other Ambulatory Visit: Payer: Self-pay

## 2024-04-05 ENCOUNTER — Telehealth: Payer: Self-pay

## 2024-04-05 DIAGNOSIS — R112 Nausea with vomiting, unspecified: Secondary | ICD-10-CM

## 2024-04-05 DIAGNOSIS — K582 Mixed irritable bowel syndrome: Secondary | ICD-10-CM

## 2024-04-05 DIAGNOSIS — K760 Fatty (change of) liver, not elsewhere classified: Secondary | ICD-10-CM

## 2024-04-05 NOTE — Progress Notes (Signed)
 Ultrasound recall

## 2024-04-05 NOTE — Telephone Encounter (Signed)
-----   Message from [facility] Denyzha K sent at 02/22/2024  3:23 PM EDT ----- Regarding: Recall ultrasound Recall US  due for June

## 2024-04-09 ENCOUNTER — Other Ambulatory Visit: Payer: Self-pay | Admitting: Physician Assistant

## 2024-04-10 ENCOUNTER — Other Ambulatory Visit (HOSPITAL_COMMUNITY)

## 2024-04-18 ENCOUNTER — Ambulatory Visit: Payer: BC Managed Care – PPO | Admitting: Rheumatology

## 2024-04-26 ENCOUNTER — Other Ambulatory Visit (INDEPENDENT_AMBULATORY_CARE_PROVIDER_SITE_OTHER)

## 2024-04-26 DIAGNOSIS — R710 Precipitous drop in hematocrit: Secondary | ICD-10-CM | POA: Diagnosis not present

## 2024-04-26 DIAGNOSIS — R109 Unspecified abdominal pain: Secondary | ICD-10-CM

## 2024-04-26 LAB — URINALYSIS, ROUTINE W REFLEX MICROSCOPIC
Bilirubin Urine: NEGATIVE
Hgb urine dipstick: NEGATIVE
Ketones, ur: NEGATIVE
Nitrite: NEGATIVE
Specific Gravity, Urine: 1.015 (ref 1.000–1.030)
Total Protein, Urine: NEGATIVE
Urine Glucose: NEGATIVE
Urobilinogen, UA: 0.2 (ref 0.0–1.0)
pH: 6 (ref 5.0–8.0)

## 2024-04-26 LAB — CBC WITH DIFFERENTIAL/PLATELET
Basophils Absolute: 0 10*3/uL (ref 0.0–0.1)
Basophils Relative: 0.1 % (ref 0.0–3.0)
Eosinophils Absolute: 0.1 10*3/uL (ref 0.0–0.7)
Eosinophils Relative: 1.3 % (ref 0.0–5.0)
HCT: 36.8 % (ref 36.0–46.0)
Hemoglobin: 12.4 g/dL (ref 12.0–15.0)
Lymphocytes Relative: 36.9 % (ref 12.0–46.0)
Lymphs Abs: 2.6 10*3/uL (ref 0.7–4.0)
MCHC: 33.7 g/dL (ref 30.0–36.0)
MCV: 89.3 fl (ref 78.0–100.0)
Monocytes Absolute: 0.6 10*3/uL (ref 0.1–1.0)
Monocytes Relative: 8.3 % (ref 3.0–12.0)
Neutro Abs: 3.8 10*3/uL (ref 1.4–7.7)
Neutrophils Relative %: 53.4 % (ref 43.0–77.0)
Platelets: 315 10*3/uL (ref 150.0–400.0)
RBC: 4.12 Mil/uL (ref 3.87–5.11)
RDW: 13.6 % (ref 11.5–15.5)
WBC: 7.1 10*3/uL (ref 4.0–10.5)

## 2024-04-26 LAB — IBC + FERRITIN
Ferritin: 330.4 ng/mL — ABNORMAL HIGH (ref 10.0–291.0)
Iron: 93 ug/dL (ref 42–145)
Saturation Ratios: 25.9 % (ref 20.0–50.0)
TIBC: 358.4 ug/dL (ref 250.0–450.0)
Transferrin: 256 mg/dL (ref 212.0–360.0)

## 2024-04-27 LAB — URINE CULTURE
MICRO NUMBER:: 16606275
SPECIMEN QUALITY:: ADEQUATE

## 2024-04-29 ENCOUNTER — Ambulatory Visit: Payer: Self-pay | Admitting: Physician Assistant

## 2024-04-29 ENCOUNTER — Encounter: Payer: Self-pay | Admitting: Physician Assistant

## 2024-07-12 ENCOUNTER — Ambulatory Visit: Admitting: Physician Assistant

## 2024-07-12 ENCOUNTER — Encounter: Payer: Self-pay | Admitting: Physician Assistant

## 2024-07-12 VITALS — BP 122/80 | HR 76 | Temp 97.3°F | Ht 64.5 in | Wt 195.0 lb

## 2024-07-12 DIAGNOSIS — R300 Dysuria: Secondary | ICD-10-CM | POA: Diagnosis not present

## 2024-07-12 LAB — POCT URINALYSIS DIPSTICK
Bilirubin, UA: NEGATIVE
Blood, UA: NEGATIVE
Glucose, UA: NEGATIVE
Ketones, UA: NEGATIVE
Nitrite, UA: NEGATIVE
Protein, UA: NEGATIVE
Spec Grav, UA: 1.02 (ref 1.010–1.025)
Urobilinogen, UA: 0.2 U/dL
pH, UA: 6 (ref 5.0–8.0)

## 2024-07-12 MED ORDER — CEPHALEXIN 500 MG PO CAPS: 500.0000 mg | ORAL_CAPSULE | Freq: Two times a day (BID) | ORAL | 0 refills | Status: DC

## 2024-07-12 NOTE — Progress Notes (Signed)
 Holly Richardson is a 63 y.o. female here for a follow up of a pre-existing problem.  History of Present Illness:   Chief Complaint  Patient presents with   Back Pain    Pt c/o low back pain, low grade fever started a week ago.    Discussed the use of AI scribe software for clinical note transcription with the patient, who gave verbal consent to proceed.  History of Present Illness Holly Richardson is a 62 year old female who presents with urinary symptom(s).  She underwent surgery on August 13th to repair a torn Achilles tendon, which involved detaching and shaving the tendon before reattaching it. She returned to work on August 18th and is experiencing challenges with mobility, using crutches, a scooter, and a wheelchair for assistance.  Post-operatively, she experienced significant discomfort when urinating, attributed to catheter use during surgery, which has since improved. She had a fever earlier in the week, with temperatures ranging from 45F to 100.60F, but denies any upper respiratory symptoms. 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  Her current medications include Metamucil and another fiber supplement to manage constipation, related to her reluctance to drink and eat at work due to limited bathroom access.    Past Medical History:  Diagnosis Date   Anorexia 1982   Anxiety    Arthritis    Bipolar 2 disorder (HCC), Rx Prozac and Lithium  01/24/2019   Cardiac murmur, 2016 ECHO: LVEF 60-65%, aortic valve sclerosis, trivial TR, ASCVD 2.8% 08/03/2015   Depression    Essential hypertension, now controlled on no medication 05/31/2014   Heart murmur    History of chicken pox    Hyperlipidemia LDL goal <130, Rx Zocor and Fenofibrate  05/31/2014   Hypertension    Irritable bowel syndrome with diarrhea 09/30/2019   Kidney stones    Primary osteoarthritis of both knees, Rx Celebrex  11/28/2018   Recurrent major depressive  disorder, in partial remission (HCC) 05/31/2014   Vaginal delivery 1992     Social History   Tobacco Use   Smoking status: Never    Passive exposure: Never   Smokeless tobacco: Never  Vaping Use   Vaping status: Never Used  Substance Use Topics   Alcohol use: Yes    Comment: occ   Drug use: No    Past Surgical History:  Procedure Laterality Date   ABDOMINAL SURGERY     APPENDECTOMY     Bladder Tack     BREAST BIOPSY Left 2015   BREAST BIOPSY Right 2018   BREAST BIOPSY Right 12/21/2021   CESAREAN SECTION  1994   COLONOSCOPY  2015   Aundria Ladell Eck, MD - [place], [place]   KNEE ARTHROSCOPY Right 03/2023   TONSILLECTOMY AND ADENOIDECTOMY      Family History  Problem Relation Age of Onset   CAD Mother    Arthritis Mother    Heart disease Mother    Hypertension Mother    Kidney disease Mother        refusing HD   Hypertension Father    Heart disease Father    Stroke Father    Healthy Sister    Heart disease Sister    Healthy Sister    Healthy Brother    Cystic fibrosis Son    Kidney Stones Son    Healthy Son     Allergies  Allergen Reactions   Codeine Other (See Comments)    Headache  Current Medications:   Current Outpatient Medications:    albuterol  (VENTOLIN  HFA) 108 (90 Base) MCG/ACT inhaler, Inhale 2 puffs into the lungs every 6 (six) hours as needed for wheezing or shortness of breath. (Patient taking differently: Inhale 2 puffs into the lungs as needed for wheezing or shortness of breath.), Disp: 8 g, Rfl: 0   ALPRAZolam (XANAX) 0.5 MG tablet, Take 0.5 mg by mouth as needed., Disp: , Rfl:    aspirin EC 81 MG tablet, Take 81 mg by mouth daily. Swallow whole., Disp: , Rfl:    atorvastatin  (LIPITOR) 20 MG tablet, Take 1 tablet (20 mg total) by mouth daily., Disp: 90 tablet, Rfl: 3   Cholecalciferol (VITAMIN D -1000 MAX ST) 25 MCG (1000 UT) tablet, TAKE ONE CAPSULE BY MOUTH DAILY, Disp: 90 tablet, Rfl: 3   Cyanocobalamin  (B-12) 1000 MCG TABS,  Take 1,000 mcg by mouth daily., Disp: , Rfl:    estradiol (ESTRACE) 0.1 MG/GM vaginal cream, Place 1 Applicatorful vaginally as needed., Disp: , Rfl:    fenofibrate  160 MG tablet, TAKE ONE (1) TABLET BY MOUTH EVERY DAY, Disp: 90 tablet, Rfl: 3   FLUoxetine (PROZAC) 20 MG capsule, Take 20 mg by mouth at bedtime., Disp: , Rfl:    lithium  carbonate 300 MG capsule, Take 300 mg by mouth 2 (two) times daily with a meal., Disp: , Rfl:    Omega-3 Fatty Acids (FISH OIL) 1000 MG CAPS, Take 1 capsule by mouth daily., Disp: , Rfl:    ondansetron  (ZOFRAN -ODT) 4 MG disintegrating tablet, Take 1 tablet (4 mg total) by mouth every 8 (eight) hours as needed for nausea or vomiting., Disp: 30 tablet, Rfl: 1   promethazine  (PHENERGAN ) 25 MG tablet, Take 1 tablet (25 mg total) by mouth every 6 (six) hours as needed for nausea or vomiting., Disp: 30 tablet, Rfl: 0   telmisartan  (MICARDIS ) 20 MG tablet, Take 1 tablet (20 mg total) by mouth daily., Disp: 90 tablet, Rfl: 3   valACYclovir  (VALTREX ) 1000 MG tablet, Take 2 grams twice a day for 1 day for flare. (Patient taking differently: Take 1,000 mg by mouth as needed (flares).), Disp: 30 tablet, Rfl: 1   Review of Systems:   Negative unless otherwise specified per HPI.  Vitals:   Vitals:   07/12/24 1122  BP: 122/80  Pulse: 76  Temp: (!) 97.3 F (36.3 C)  TempSrc: Temporal  SpO2: 98%  Weight: 195 lb (88.5 kg)  Height: 5' 4.5 (1.638 m)     Body mass index is 32.95 kg/m.  Physical Exam:   Physical Exam Vitals and nursing note reviewed.  Constitutional:      General: She is not in acute distress.    Appearance: She is well-developed. She is not ill-appearing or toxic-appearing.  Cardiovascular:     Rate and Rhythm: Normal rate and regular rhythm.     Pulses: Normal pulses.     Heart sounds: Normal heart sounds, S1 normal and S2 normal.  Pulmonary:     Effort: Pulmonary effort is normal.     Breath sounds: Normal breath sounds.  Abdominal:      Tenderness: There is no right CVA tenderness or left CVA tenderness.  Skin:    General: Skin is warm and dry.  Neurological:     Mental Status: She is alert.     GCS: GCS eye subscore is 4. GCS verbal subscore is 5. GCS motor subscore is 6.  Psychiatric:        Speech: Speech normal.  Behavior: Behavior normal. Behavior is cooperative.     Assessment and Plan:   Assessment and Plan Assessment & Plan Dysuria No red flags Start cephalexin  Push fluids Culture pending Follow up if no improvement     [name], PA-C

## 2024-07-13 LAB — URINE CULTURE
MICRO NUMBER:: 16929032
SPECIMEN QUALITY:: ADEQUATE

## 2024-07-15 ENCOUNTER — Ambulatory Visit: Payer: Self-pay | Admitting: Physician Assistant

## 2024-07-19 ENCOUNTER — Other Ambulatory Visit: Payer: Self-pay | Admitting: Physician Assistant

## 2024-07-30 ENCOUNTER — Encounter: Payer: Self-pay | Admitting: Physician Assistant

## 2024-07-30 ENCOUNTER — Telehealth: Admitting: Physician Assistant

## 2024-07-30 VITALS — BP 129/77 | HR 72 | Ht 64.5 in | Wt 195.5 lb

## 2024-07-30 DIAGNOSIS — R051 Acute cough: Secondary | ICD-10-CM | POA: Diagnosis not present

## 2024-07-30 MED ORDER — ALBUTEROL SULFATE HFA 108 (90 BASE) MCG/ACT IN AERS: 2.0000 | INHALATION_SPRAY | Freq: Four times a day (QID) | RESPIRATORY_TRACT | 0 refills | Status: AC | PRN

## 2024-07-30 MED ORDER — AZITHROMYCIN 250 MG PO TABS: ORAL_TABLET | ORAL | 0 refills | Status: AC

## 2024-07-30 MED ORDER — AMOXICILLIN-POT CLAVULANATE 875-125 MG PO TABS: 1.0000 | ORAL_TABLET | Freq: Two times a day (BID) | ORAL | 0 refills | Status: DC

## 2024-07-30 MED ORDER — PREDNISONE 20 MG PO TABS: 20.0000 mg | ORAL_TABLET | Freq: Two times a day (BID) | ORAL | 0 refills | Status: DC

## 2024-07-30 NOTE — Progress Notes (Signed)
 Virtual Visit via Video Note   I, [name], connected with  Holly Richardson  ([id number], 30-Nov-1960) on 07/30/24 at 10:40 AM EDT by a video-enabled telemedicine application and verified that I am speaking with the correct person using two identifiers.  Location: Patient: Home Provider: [facility] office   I discussed the limitations of evaluation and management by telemedicine and the availability of in person appointments. The patient expressed understanding and agreed to proceed.    Discussed the use of AI scribe software for clinical note transcription with the patient, who gave verbal consent to proceed.  History of Present Illness Holly Richardson is a 63 year old female who presents with a persistent cough and headache.  She initially experienced a wet cough that has transitioned to a dry cough, accompanied by a significant headache and loss of smell and taste. Two COVID tests were negative. Severe back pain while breathing occurred last Tuesday but has since subsided.   Over the weekend, symptoms worsened, leading to missed work on Monday and Tuesday due to fatigue. She is using Delsym and Tylenol  for symptom management and finds her inhaler helpful after coughing episodes. Her blood pressure was slightly elevated recently.   She experiences a sensation of pressure around her vagina when coughing, causing discomfort during urination, and has increased constipation, managed with Miralax. She is allergic to codeine and avoids codeine-based cough syrups.    Problems:  Patient Active Problem List   Diagnosis Date Noted   Irritable bowel syndrome with diarrhea 09/30/2019   Chronic pain of right knee 07/30/2019   Bipolar 2 disorder (HCC), Rx Prozac and Lithium  01/24/2019   Eating disorder in remission, Hx of anorexia as a teen, struggles with emotional and binge eating as an adult, followed by Psych, RD; Intuitive Eating 01/24/2019   Primary osteoarthritis of  both knees, Rx Celebrex  11/28/2018   Bloating 03/16/2017   Insulin resistance, diet-controlled 10/24/2016   Cardiac murmur, 2016 ECHO: LVEF 60-65%, aortic valve sclerosis, trivial TR, ASCVD 2.8% 08/03/2015   Essential hypertension, now controlled on no medication 05/31/2014   Hyperlipidemia LDL goal <130, Rx Zocor and Fenofibrate  05/31/2014   Recurrent major depressive disorder, in partial remission 05/31/2014    Allergies:  Allergies  Allergen Reactions   Codeine Other (See Comments)    Headache   Medications:  Current Outpatient Medications:    albuterol  (VENTOLIN  HFA) 108 (90 Base) MCG/ACT inhaler, Inhale 2 puffs into the lungs every 6 (six) hours as needed for wheezing or shortness of breath. (Patient taking differently: Inhale 2 puffs into the lungs as needed for wheezing or shortness of breath.), Disp: 8 g, Rfl: 0   ALPRAZolam (XANAX) 0.5 MG tablet, Take 0.5 mg by mouth as needed., Disp: , Rfl:    aspirin EC 81 MG tablet, Take 81 mg by mouth daily. Swallow whole., Disp: , Rfl:    atorvastatin  (LIPITOR) 20 MG tablet, TAKE ONE (1) TABLET BY MOUTH EVERY DAY, Disp: 90 tablet, Rfl: 0   Cholecalciferol (VITAMIN D -1000 MAX ST) 25 MCG (1000 UT) tablet, TAKE ONE CAPSULE BY MOUTH DAILY, Disp: 90 tablet, Rfl: 3   Cyanocobalamin  (B-12) 1000 MCG TABS, Take 1,000 mcg by mouth daily., Disp: , Rfl:    estradiol (ESTRACE) 0.1 MG/GM vaginal cream, Place 1 Applicatorful vaginally as needed., Disp: , Rfl:    fenofibrate  160 MG tablet, TAKE ONE (1) TABLET BY MOUTH EVERY DAY, Disp: 90 tablet, Rfl: 3   FLUoxetine (PROZAC) 20 MG capsule, Take  20 mg by mouth at bedtime., Disp: , Rfl:    lithium  carbonate 300 MG capsule, Take 300 mg by mouth 2 (two) times daily with a meal., Disp: , Rfl:    Omega-3 Fatty Acids (FISH OIL) 1000 MG CAPS, Take 1 capsule by mouth daily., Disp: , Rfl:    ondansetron  (ZOFRAN -ODT) 4 MG disintegrating tablet, Take 1 tablet (4 mg total) by mouth every 8 (eight) hours as needed for  nausea or vomiting., Disp: 30 tablet, Rfl: 1   promethazine  (PHENERGAN ) 25 MG tablet, Take 1 tablet (25 mg total) by mouth every 6 (six) hours as needed for nausea or vomiting., Disp: 30 tablet, Rfl: 0   telmisartan  (MICARDIS ) 20 MG tablet, TAKE ONE (1) TABLET BY MOUTH EVERY DAY, Disp: 90 tablet, Rfl: 0   valACYclovir  (VALTREX ) 1000 MG tablet, Take 2 grams twice a day for 1 day for flare. (Patient taking differently: Take 1,000 mg by mouth as needed (flares).), Disp: 30 tablet, Rfl: 1  Observations/Objective: Patient is well-developed, well-nourished in no acute distress.  Resting comfortably  at home.  Head is normocephalic, atraumatic.  No labored breathing.  Speech is clear and coherent with logical content.  Patient is alert and oriented at baseline.   Assessment and Plan Assessment & Plan Acute cough Suspected pneumonia due to severe symptoms and progression.  - Prescribed Z-Pak and Augmentin . - Prescribed steroids. - Provided work absence documentation. - Advised low threshold for chest x-ray if no improvement.  No obvious signs of distress on my exam warranting ER evaluation. Discussed ER precautions.    Follow Up Instructions: I discussed the assessment and treatment plan with the patient. The patient was provided an opportunity to ask questions and all were answered. The patient agreed with the plan and demonstrated an understanding of the instructions.  A copy of instructions were sent to the patient via MyChart unless otherwise noted below.   The patient was advised to call back or seek an in-person evaluation if the symptoms worsen or if the condition fails to improve as anticipated.  [name], [place]

## 2024-09-13 ENCOUNTER — Other Ambulatory Visit: Payer: Self-pay | Admitting: Physician Assistant

## 2024-09-24 ENCOUNTER — Encounter: Payer: Self-pay | Admitting: Physician Assistant

## 2024-09-24 ENCOUNTER — Ambulatory Visit: Admitting: Physician Assistant

## 2024-09-24 ENCOUNTER — Ambulatory Visit: Payer: Self-pay

## 2024-09-24 ENCOUNTER — Telehealth: Payer: Self-pay | Admitting: Physician Assistant

## 2024-09-24 VITALS — BP 120/80 | HR 73 | Temp 97.9°F | Ht 64.5 in | Wt 197.2 lb

## 2024-09-24 DIAGNOSIS — J0111 Acute recurrent frontal sinusitis: Secondary | ICD-10-CM

## 2024-09-24 MED ORDER — AZITHROMYCIN 250 MG PO TABS: ORAL_TABLET | ORAL | 0 refills | Status: AC

## 2024-09-24 MED ORDER — PREDNISONE 20 MG PO TABS: 20.0000 mg | ORAL_TABLET | Freq: Two times a day (BID) | ORAL | 0 refills | Status: AC

## 2024-09-24 NOTE — Telephone Encounter (Signed)
 Appt today

## 2024-09-24 NOTE — Telephone Encounter (Signed)
     FYI Only or Action Required?: FYI only for provider: appointment scheduled on patient has an existing appointment today 09/24/2024 at 1:20pm with her PCP [name].  Patient was last seen in primary care on 07/30/2024 by Buttner Lucie, PA.  Called Nurse Triage reporting Cough.  Symptoms began 5 days ago.  Interventions attempted: Rest, hydration, or home remedies.  Symptoms are: gradually worsening.  Triage Disposition: See Physician Within 24 Hours  Patient/caregiver understands and will follow disposition?: Yes                  Copied from CRM #[id number]. Topic: Clinical - Red Word Triage >> Sep 24, 2024 11:23 AM [name] wrote: [facility] that prompted transfer to Nurse Triage:Patient called in, scheduled a appointment via mychart but was called to be triaged stated she has congestion which is causing the tightness in chest from coughing Reason for Disposition  [1] Continuous (nonstop) coughing interferes with work or school AND [2] no improvement using cough treatment per Care Advice  Answer Assessment - Initial Assessment Questions Patient states that she has had a dry cough x 5 days Coughs up a very small amount sometimes and when she blows 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 Patient is advised that if anything worsens to go to the Emergency Room. Patient verbalized understanding.   1. ONSET: When did the cough begin?      5 days ago 2. SEVERITY: How bad is the cough today?      Starting to make her chest sore 3. SPUTUM: Describe the color of your sputum (e.g., none, dry cough; clear, white, yellow, green)     Some green 4. HEMOPTYSIS: Are you coughing up any blood? If Yes, ask: How much? (e.g., flecks, streaks, tablespoons, etc.)      no 5. DIFFICULTY BREATHING: Are you having difficulty breathing? If Yes, ask: How bad is it? (e.g., mild, moderate, severe)      no 6. FEVER: Do you have a fever? If Yes, ask: What is your temperature, how was it measured, and when did it start?     no 7. CARDIAC HISTORY: Do you have any history of heart disease? (e.g., heart attack, congestive heart failure)      denies 8. LUNG HISTORY: Do you have any history of lung disease?  (e.g., pulmonary embolus, asthma, emphysema)     denies 9. PE RISK FACTORS: Do you have a history of blood clots? (or: recent major surgery, recent prolonged travel, bedridden)     denies 10. OTHER SYMPTOMS: Do you have any other symptoms? (e.g., runny nose, wheezing, chest pain)       denies  Protocols used: Cough - Acute Non-Productive-A-AH

## 2024-09-24 NOTE — Telephone Encounter (Signed)
 LVM to cllarify symptoms for possible Triage

## 2024-09-24 NOTE — Progress Notes (Signed)
 Holly Richardson is a 63 y.o. female here for a new problem.   History of Present Illness:   Chief Complaint  Patient presents with   Cough    Pt c/o cough since last Thursday, expectorating green brown sputum, chest congestion,also having sinus headache.Denies fever or chills.    Discussed the use of AI scribe software for clinical note transcription with the patient, who gave verbal consent to proceed.  History of Present Illness   Holly Richardson is a 63 year old female who presents with worsening sinus congestion and cough.  Symptoms began last Thursday with progressive worsening. She has a non-productive cough with occasional expectoration of mucus and head pressure from sinus congestion. An upset stomach is present, likely due to mucus ingestion. There is no severe sore throat, fever, or ear pain. She has not used her inhaler and avoids Mucinex due to adverse reactions. A Z-Pak has been effective in the past. She works in an environment with RSV exposure and is concerned about illness due to contact with sick children. She has missed only one day of work.        Past Medical History:  Diagnosis Date   Anorexia 1982   Anxiety    Arthritis    Bipolar 2 disorder (HCC), Rx Prozac and Lithium  01/24/2019   Cardiac murmur, 2016 ECHO: LVEF 60-65%, aortic valve sclerosis, trivial TR, ASCVD 2.8% 08/03/2015   Depression    Essential hypertension, now controlled on no medication 05/31/2014   Heart murmur    History of chicken pox    Hyperlipidemia LDL goal <130, Rx Zocor and Fenofibrate  05/31/2014   Hypertension    Irritable bowel syndrome with diarrhea 09/30/2019   Kidney stones    Primary osteoarthritis of both knees, Rx Celebrex  11/28/2018   Recurrent major depressive disorder, in partial remission 05/31/2014   Vaginal delivery 1992     Social History   Tobacco Use   Smoking status: Never    Passive exposure: Never   Smokeless tobacco: Never  Vaping Use   Vaping status:  Never Used  Substance Use Topics   Alcohol use: Yes    Comment: occ   Drug use: No    Past Surgical History:  Procedure Laterality Date   ABDOMINAL SURGERY     APPENDECTOMY     Bladder Tack     BREAST BIOPSY Left 2015   BREAST BIOPSY Right 2018   BREAST BIOPSY Right 12/21/2021   CESAREAN SECTION  1994   COLONOSCOPY  2015   [place], Ladell Eck, MD - [facility] [place], [place]   KNEE ARTHROSCOPY Right 03/2023   TONSILLECTOMY AND ADENOIDECTOMY      Family History  Problem Relation Age of Onset   CAD Mother    Arthritis Mother    Heart disease Mother    Hypertension Mother    Kidney disease Mother        refusing HD   Hypertension Father    Heart disease Father    Stroke Father    Healthy Sister    Heart disease Sister    Healthy Sister    Healthy Brother    Cystic fibrosis Son    Kidney Stones Son    Healthy Son     Allergies  Allergen Reactions   Codeine Other (See Comments)    Headache    Current Medications:   Current Outpatient Medications:    albuterol  (VENTOLIN  HFA) 108 (90 Base) MCG/ACT inhaler, Inhale 2 puffs into the lungs every  6 (six) hours as needed for wheezing or shortness of breath., Disp: 8 g, Rfl: 0   ALPRAZolam (XANAX) 0.5 MG tablet, Take 0.5 mg by mouth as needed., Disp: , Rfl:    aspirin EC 81 MG tablet, Take 81 mg by mouth daily. Swallow whole., Disp: , Rfl:    atorvastatin  (LIPITOR) 20 MG tablet, TAKE ONE (1) TABLET BY MOUTH EVERY DAY, Disp: 90 tablet, Rfl: 0   Cholecalciferol (VITAMIN D -1000 MAX ST) 25 MCG (1000 UT) tablet, TAKE ONE CAPSULE BY MOUTH DAILY, Disp: 90 tablet, Rfl: 3   Cyanocobalamin  (B-12) 1000 MCG TABS, Take 1,000 mcg by mouth daily., Disp: , Rfl:    estradiol (ESTRACE) 0.1 MG/GM vaginal cream, Place 1 Applicatorful vaginally as needed., Disp: , Rfl:    fenofibrate  160 MG tablet, TAKE ONE (1) TABLET BY MOUTH EVERY DAY, Disp: 90 tablet, Rfl: 3   FLUoxetine (PROZAC) 20 MG capsule, Take 20 mg by mouth at bedtime., Disp: , Rfl:     lithium  carbonate 300 MG capsule, Take 300 mg by mouth 2 (two) times daily with a meal., Disp: , Rfl:    Omega-3 Fatty Acids (FISH OIL) 1000 MG CAPS, Take 1 capsule by mouth daily., Disp: , Rfl:    ondansetron  (ZOFRAN -ODT) 4 MG disintegrating tablet, Take 1 tablet (4 mg total) by mouth every 8 (eight) hours as needed for nausea or vomiting., Disp: 30 tablet, Rfl: 1   promethazine  (PHENERGAN ) 25 MG tablet, Take 1 tablet (25 mg total) by mouth every 6 (six) hours as needed for nausea or vomiting., Disp: 30 tablet, Rfl: 0   telmisartan  (MICARDIS ) 20 MG tablet, TAKE ONE (1) TABLET BY MOUTH EVERY DAY, Disp: 90 tablet, Rfl: 0   valACYclovir  (VALTREX ) 1000 MG tablet, Take 2 grams twice a day for 1 day for flare. (Patient taking differently: Take 1,000 mg by mouth as needed (flares).), Disp: 30 tablet, Rfl: 1   Review of Systems:   Negative unless otherwise specified per HPI.  Vitals:   Vitals:   09/24/24 1325  BP: 120/80  Pulse: 73  Temp: 97.9 F (36.6 C)  TempSrc: Temporal  SpO2: 98%  Weight: 197 lb 4 oz (89.5 kg)  Height: 5' 4.5 (1.638 m)     Body mass index is 33.33 kg/m.  Physical Exam:   Physical Exam Vitals and nursing note reviewed.  Constitutional:      General: She is not in acute distress.    Appearance: She is well-developed. She is not ill-appearing or toxic-appearing.  HENT:     Head: Normocephalic and atraumatic.     Right Ear: Tympanic membrane, ear canal and external ear normal. Tympanic membrane is not erythematous, retracted or bulging.     Left Ear: Tympanic membrane, ear canal and external ear normal. Tympanic membrane is not erythematous, retracted or bulging.     Nose:     Right Sinus: Frontal sinus tenderness present. No maxillary sinus tenderness.     Left Sinus: Frontal sinus tenderness present. No maxillary sinus tenderness.     Mouth/Throat:     Pharynx: Uvula midline. No posterior oropharyngeal erythema.  Eyes:     General: Lids are normal.      Conjunctiva/sclera: Conjunctivae normal.  Neck:     Trachea: Trachea normal.  Cardiovascular:     Rate and Rhythm: Normal rate and regular rhythm.     Heart sounds: Normal heart sounds, S1 normal and S2 normal.  Pulmonary:     Effort: Pulmonary effort is normal.  Breath sounds: Normal breath sounds. No decreased breath sounds, wheezing, rhonchi or rales.  Lymphadenopathy:     Cervical: No cervical adenopathy.  Skin:    General: Skin is warm and dry.  Neurological:     Mental Status: She is alert.  Psychiatric:        Speech: Speech normal.        Behavior: Behavior normal. Behavior is cooperative.     Assessment and Plan:   Assessment and Plan    Acute sinusitis No red flags - Prescribed Z-Pak (azithromycin ). - Prescribed prednisone  for future use if symptoms worsen. - follow up if no improvement or any new/worsening symptom(s)    [name], PA-C

## 2024-09-24 NOTE — Telephone Encounter (Signed)
 Called [facility] to inform them that patient has been triaged and she will be there for her appointment today that she scheduled.
